# Patient Record
Sex: Male | Born: 1955 | Race: White | Hispanic: No | Marital: Married | Smoking: Never smoker
Health system: Southern US, Community
[De-identification: ages and names within clinical notes are randomized; demographics above are authoritative.]

## PROBLEM LIST (undated history)

## (undated) DIAGNOSIS — E7800 Pure hypercholesterolemia, unspecified: Secondary | ICD-10-CM

## (undated) DIAGNOSIS — N19 Unspecified kidney failure: Secondary | ICD-10-CM

## (undated) DIAGNOSIS — E785 Hyperlipidemia, unspecified: Secondary | ICD-10-CM

## (undated) DIAGNOSIS — E119 Type 2 diabetes mellitus without complications: Secondary | ICD-10-CM

## (undated) DIAGNOSIS — I10 Essential (primary) hypertension: Secondary | ICD-10-CM

## (undated) DIAGNOSIS — K219 Gastro-esophageal reflux disease without esophagitis: Secondary | ICD-10-CM

## (undated) HISTORY — DX: Type 2 diabetes mellitus without complications: E11.9

## (undated) HISTORY — DX: Gastro-esophageal reflux disease without esophagitis: K21.9

## (undated) HISTORY — DX: Pure hypercholesterolemia, unspecified: E78.00

## (undated) HISTORY — DX: Essential (primary) hypertension: I10

## (undated) HISTORY — DX: Unspecified kidney failure: N19

## (undated) HISTORY — DX: Hyperlipidemia, unspecified: E78.5

## (undated) HISTORY — PX: HERNIA REPAIR: SHX51

---

## 1997-12-19 ENCOUNTER — Ambulatory Visit: Admission: RE | Admit: 1997-12-19 | Discharge: 1997-12-19 | Payer: Self-pay | Admitting: Otolaryngology

## 1999-01-23 ENCOUNTER — Emergency Department (HOSPITAL_COMMUNITY): Admission: EM | Admit: 1999-01-23 | Discharge: 1999-01-23 | Payer: Self-pay | Admitting: *Deleted

## 2004-03-30 ENCOUNTER — Inpatient Hospital Stay (HOSPITAL_COMMUNITY): Admission: AC | Admit: 2004-03-30 | Discharge: 2004-04-15 | Payer: Self-pay

## 2004-04-14 ENCOUNTER — Ambulatory Visit: Payer: Self-pay | Admitting: Physical Medicine & Rehabilitation

## 2004-04-15 ENCOUNTER — Inpatient Hospital Stay (HOSPITAL_COMMUNITY)
Admission: RE | Admit: 2004-04-15 | Discharge: 2004-04-26 | Payer: Self-pay | Admitting: Physical Medicine & Rehabilitation

## 2004-04-29 ENCOUNTER — Encounter (HOSPITAL_COMMUNITY)
Admission: RE | Admit: 2004-04-29 | Discharge: 2004-05-29 | Payer: Self-pay | Admitting: Physical Medicine & Rehabilitation

## 2004-05-28 ENCOUNTER — Encounter
Admission: RE | Admit: 2004-05-28 | Discharge: 2004-08-26 | Payer: Self-pay | Admitting: Physical Medicine & Rehabilitation

## 2004-06-02 ENCOUNTER — Ambulatory Visit: Payer: Self-pay | Admitting: Physical Medicine & Rehabilitation

## 2004-06-03 ENCOUNTER — Encounter (HOSPITAL_COMMUNITY)
Admission: RE | Admit: 2004-06-03 | Discharge: 2004-07-03 | Payer: Self-pay | Admitting: Physical Medicine & Rehabilitation

## 2004-07-05 ENCOUNTER — Encounter (HOSPITAL_COMMUNITY)
Admission: RE | Admit: 2004-07-05 | Discharge: 2004-08-04 | Payer: Self-pay | Admitting: Physical Medicine & Rehabilitation

## 2004-07-16 ENCOUNTER — Encounter
Admission: RE | Admit: 2004-07-16 | Discharge: 2004-10-14 | Payer: Self-pay | Admitting: Physical Medicine & Rehabilitation

## 2004-07-16 ENCOUNTER — Ambulatory Visit: Payer: Self-pay | Admitting: Psychology

## 2004-08-09 ENCOUNTER — Ambulatory Visit: Payer: Self-pay | Admitting: Physical Medicine & Rehabilitation

## 2004-09-03 ENCOUNTER — Encounter
Admission: RE | Admit: 2004-09-03 | Discharge: 2004-12-02 | Payer: Self-pay | Admitting: Physical Medicine & Rehabilitation

## 2004-11-24 ENCOUNTER — Ambulatory Visit: Payer: Self-pay | Admitting: Physical Medicine & Rehabilitation
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
## 2005-06-01 ENCOUNTER — Ambulatory Visit (HOSPITAL_BASED_OUTPATIENT_CLINIC_OR_DEPARTMENT_OTHER): Admission: RE | Admit: 2005-06-01 | Discharge: 2005-06-01 | Payer: Self-pay | Admitting: Ophthalmology

## 2010-06-20 ENCOUNTER — Encounter: Payer: Self-pay | Admitting: Neurosurgery

## 2011-09-08 DIAGNOSIS — E7800 Pure hypercholesterolemia, unspecified: Secondary | ICD-10-CM | POA: Diagnosis not present

## 2011-09-12 DIAGNOSIS — I10 Essential (primary) hypertension: Secondary | ICD-10-CM | POA: Diagnosis not present

## 2011-09-12 DIAGNOSIS — IMO0001 Reserved for inherently not codable concepts without codable children: Secondary | ICD-10-CM | POA: Diagnosis not present

## 2011-09-12 DIAGNOSIS — M109 Gout, unspecified: Secondary | ICD-10-CM | POA: Diagnosis not present

## 2011-09-12 DIAGNOSIS — E781 Pure hyperglyceridemia: Secondary | ICD-10-CM | POA: Diagnosis not present

## 2011-09-12 DIAGNOSIS — E7800 Pure hypercholesterolemia, unspecified: Secondary | ICD-10-CM | POA: Diagnosis not present

## 2012-01-23 DIAGNOSIS — E781 Pure hyperglyceridemia: Secondary | ICD-10-CM | POA: Diagnosis not present

## 2012-01-23 DIAGNOSIS — IMO0001 Reserved for inherently not codable concepts without codable children: Secondary | ICD-10-CM | POA: Diagnosis not present

## 2012-01-23 DIAGNOSIS — I10 Essential (primary) hypertension: Secondary | ICD-10-CM | POA: Diagnosis not present

## 2012-01-28 DIAGNOSIS — L500 Allergic urticaria: Secondary | ICD-10-CM | POA: Diagnosis not present

## 2012-01-28 DIAGNOSIS — R55 Syncope and collapse: Secondary | ICD-10-CM | POA: Diagnosis not present

## 2012-01-28 DIAGNOSIS — I959 Hypotension, unspecified: Secondary | ICD-10-CM | POA: Diagnosis not present

## 2012-01-28 DIAGNOSIS — T7840XA Allergy, unspecified, initial encounter: Secondary | ICD-10-CM | POA: Diagnosis not present

## 2012-01-28 DIAGNOSIS — M79609 Pain in unspecified limb: Secondary | ICD-10-CM | POA: Diagnosis not present

## 2012-01-28 DIAGNOSIS — Z79899 Other long term (current) drug therapy: Secondary | ICD-10-CM | POA: Diagnosis not present

## 2012-02-22 DIAGNOSIS — I10 Essential (primary) hypertension: Secondary | ICD-10-CM | POA: Diagnosis not present

## 2012-02-22 DIAGNOSIS — E119 Type 2 diabetes mellitus without complications: Secondary | ICD-10-CM | POA: Diagnosis not present

## 2012-02-22 DIAGNOSIS — E781 Pure hyperglyceridemia: Secondary | ICD-10-CM | POA: Diagnosis not present

## 2012-02-22 DIAGNOSIS — E7800 Pure hypercholesterolemia, unspecified: Secondary | ICD-10-CM | POA: Diagnosis not present

## 2012-02-22 DIAGNOSIS — Z23 Encounter for immunization: Secondary | ICD-10-CM | POA: Diagnosis not present

## 2012-06-12 DIAGNOSIS — E7800 Pure hypercholesterolemia, unspecified: Secondary | ICD-10-CM | POA: Diagnosis not present

## 2012-06-12 DIAGNOSIS — N508 Other specified disorders of male genital organs: Secondary | ICD-10-CM | POA: Diagnosis not present

## 2012-06-12 DIAGNOSIS — E291 Testicular hypofunction: Secondary | ICD-10-CM | POA: Diagnosis not present

## 2012-06-12 DIAGNOSIS — Z79899 Other long term (current) drug therapy: Secondary | ICD-10-CM | POA: Diagnosis not present

## 2012-06-12 DIAGNOSIS — J309 Allergic rhinitis, unspecified: Secondary | ICD-10-CM | POA: Diagnosis not present

## 2012-06-12 DIAGNOSIS — N400 Enlarged prostate without lower urinary tract symptoms: Secondary | ICD-10-CM | POA: Diagnosis not present

## 2012-06-12 DIAGNOSIS — N529 Male erectile dysfunction, unspecified: Secondary | ICD-10-CM | POA: Diagnosis not present

## 2012-06-15 DIAGNOSIS — N509 Disorder of male genital organs, unspecified: Secondary | ICD-10-CM | POA: Diagnosis not present

## 2012-06-15 DIAGNOSIS — N508 Other specified disorders of male genital organs: Secondary | ICD-10-CM | POA: Diagnosis not present

## 2012-06-15 DIAGNOSIS — N433 Hydrocele, unspecified: Secondary | ICD-10-CM | POA: Diagnosis not present

## 2012-06-20 DIAGNOSIS — E7800 Pure hypercholesterolemia, unspecified: Secondary | ICD-10-CM | POA: Diagnosis not present

## 2012-06-20 DIAGNOSIS — E349 Endocrine disorder, unspecified: Secondary | ICD-10-CM | POA: Diagnosis not present

## 2012-06-20 DIAGNOSIS — N529 Male erectile dysfunction, unspecified: Secondary | ICD-10-CM | POA: Diagnosis not present

## 2012-06-20 DIAGNOSIS — E291 Testicular hypofunction: Secondary | ICD-10-CM | POA: Diagnosis not present

## 2012-06-20 DIAGNOSIS — E119 Type 2 diabetes mellitus without complications: Secondary | ICD-10-CM | POA: Diagnosis not present

## 2012-06-20 DIAGNOSIS — I10 Essential (primary) hypertension: Secondary | ICD-10-CM | POA: Diagnosis not present

## 2012-06-25 ENCOUNTER — Ambulatory Visit: Payer: Self-pay | Admitting: Internal Medicine

## 2012-06-26 ENCOUNTER — Ambulatory Visit: Payer: Self-pay | Admitting: Internal Medicine

## 2012-06-29 ENCOUNTER — Ambulatory Visit: Payer: Self-pay | Admitting: Internal Medicine

## 2012-07-02 ENCOUNTER — Ambulatory Visit (INDEPENDENT_AMBULATORY_CARE_PROVIDER_SITE_OTHER): Payer: Medicare Other | Admitting: Internal Medicine

## 2012-07-02 ENCOUNTER — Encounter: Payer: Self-pay | Admitting: Internal Medicine

## 2012-07-02 VITALS — BP 110/82 | HR 97 | Temp 98.1°F | Resp 16 | Ht 70.0 in | Wt 225.0 lb

## 2012-07-02 DIAGNOSIS — M109 Gout, unspecified: Secondary | ICD-10-CM | POA: Insufficient documentation

## 2012-07-02 DIAGNOSIS — E785 Hyperlipidemia, unspecified: Secondary | ICD-10-CM | POA: Insufficient documentation

## 2012-07-02 DIAGNOSIS — N433 Hydrocele, unspecified: Secondary | ICD-10-CM | POA: Insufficient documentation

## 2012-07-02 DIAGNOSIS — R7989 Other specified abnormal findings of blood chemistry: Secondary | ICD-10-CM | POA: Insufficient documentation

## 2012-07-02 DIAGNOSIS — E291 Testicular hypofunction: Secondary | ICD-10-CM

## 2012-07-02 DIAGNOSIS — I10 Essential (primary) hypertension: Secondary | ICD-10-CM | POA: Insufficient documentation

## 2012-07-02 DIAGNOSIS — E119 Type 2 diabetes mellitus without complications: Secondary | ICD-10-CM | POA: Insufficient documentation

## 2012-07-02 DIAGNOSIS — N529 Male erectile dysfunction, unspecified: Secondary | ICD-10-CM

## 2012-07-02 MED ORDER — TADALAFIL 10 MG PO TABS: 10.0000 mg | ORAL_TABLET | Freq: Every day | ORAL | Status: AC | PRN

## 2012-07-02 NOTE — Progress Notes (Signed)
Subjective:     Patient ID: Steven Dominguez, male   DOB: April 29, 1956, 57 y.o.   MRN: [id number]  HPI Steven Dominguez is a 57 year old man, referred by his PCP, Dr. Neita Carp, for evaluation and treatment for low testosterone.  Steven Dominguez has a history of type 2 diabetes, controlled, with a last hemoglobin A1c of 6.9% on 06/12/2012. He also started to have erectile dysfunction 3-4 years ago (cannot maintain an erection). He has tried Viagra and this is not helping at all. He did not try Cialis. He has a pump at home. He had a testosterone level checked 2 weeks ago, and a total testosterone (collected at 9:45 AM) returned low, at 253 ng/dL ([phone]). Also, a Prolactin level was mildly high, at 18 (4-15.2). Patient was referred to endocrinology. He has missed the previous 2 appointments due to inclement weather - comes from [place].   I reviewed his records obtained from Dr. Neita Carp. Other labs obtained 2 weeks ago: LH 5.2 (0.7-8.6), TSH 2.74 (0.45-4.5), lipid panel: 137/265/28/56. CMP normal except glucose 182, total protein 5.9 (6-8.5). The BUN/creatinine ratio was 19/1.17. The PSA was 0.6. He had a testicular ultrasound on 06/15/2012 which showed bilateral hydroceles, right more than left.  No decreased libido, no decrease general drive - he was exercising 3-4 x a week, 30 min, no increased   fatigability; in the last 3-4 weeks, only exercising 1-2 x a week Has problems with erections - cannot maintain one No trauma to testes, testicular irradiation. Had left inguinal hernia surgery "long time ago". No h/o of mumps orchitis/h/o autoimmune ds. No h/o cryptorchidism. He grew and went through puberty like his peers.  No incomplete/delayed sexual development. No breast discomfort/gynecomastia/breast discharge.  No loss of body hair (axillary/pubic)/decreased need for shaving.  No very small testes (<5 ml)/but noticed shrinking of testes.  No height loss.  Normal smell. No hot flushes.  No FH of  hypogonadism/infertility - had 2 children. No vision problems.  Has head trauma 2005. No worst HA of his life, had milder HAs.  No FH of hemochromatosis or pituitary tumors. No excessive weight gain or loss.  No chronic pain. Not on opiates, does not take steroids other than Flonase 1-2x/week. No more than 2 drinks a day of alcohol at a time, and this is rarely.  No anabolic steroids use.  No Herbal medicines.  Not on antidepressants.  He is on disability b/c gout - last April >> could not walk. He had OSA dx 15 years ago and was on a CPAP (or BiPAP) machine, not in last 10 years.   Review of Systems Constitutional: no weight gain/loss, no fatigue, no subjective hyperthermia/hypothermia; poor sleep Eyes: no blurry vision, no xerophthalmia ENT: no sore throat, no nodules palpated in throat, no dysphagia/odynophagia, no hoarseness Cardiovascular: no CP/SOB/palpitations/leg swelling Respiratory: no cough/SOB Gastrointestinal: no N/V/+ acid reflux/both D and C, D<C Musculoskeletal: no muscle/joint aches Skin: no rashes Neurological: no tremors/numbness/tingling/dizziness/ has tinnitus Psychiatric: no depression/anxiety  Patient Active Problem List  Diagnosis  . Low testosterone  . Bilateral hydrocele  . Hyperlipidemia  . Hypertension  . Diabetes type 2, controlled  . Erectile dysfunction  . Gout   Past Surgical History  Procedure Date  . Hernia repair    History   Social History  . Marital Status: Married    Spouse Name: N/A    Number of Children: 2: 35, 33  . Years of Education: N/A   Occupational History  . Disabled, sings in choir  Social History Main Topics  . Smoking status: Never Smoker   . Smokeless tobacco: No  . Alcohol Use: No  . Drug Use: No  . Sexually Active: Yes    Birth Control/ Protection: Other-see comments     Comment: REASON FOR VISIT.   Other Topics Concern  . 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 Diabetes Mother   . Hyperlipidemia Mother   . Cancer Father     LUNG  . Diabetes Brother    Objective:   Physical Exam BP 110/82  Pulse 97  Temp 98.1 F (36.7 C) (Oral)  Resp 16  Ht 5\' 10"  (1.778 m)  Wt 225 lb (102.059 kg)  BMI 32.28 kg/m2  SpO2 95% Constitutional: overweight, in NAD Eyes: PERRLA, EOMI, no exophthalmos ENT: moist mucous membranes, no thyromegaly, no cervical lymphadenopathy Cardiovascular: RRR, No MRG, periankle edema, nonpitting, bilat. Respiratory: CTA B Gastrointestinal: abdomen soft, NT, ND, BS+ Musculoskeletal: no deformities, strength intact in all 4 Skin: moist, warm, no rashes Neurological: no tremor with outstretched hands, DTR normal in all 4 Genital exam: normal male escutcheon, no inguinal LAD, normal phallus, testes ~25 mL right, 20 mL left, no testicular masses, no penile discharge  Assessment:     1. Low testosterone - slightly high prolactin - low LH - normal TSH  2. Hyperprolactinemia  3. ED - Viagra not helping - has penile pump - did not use it yet    Plan:     1.  Low testosterone We discussed about possible causes for patient's low testosterone - both primary and central hypogonadism - I explained that, based on the labs obtained by Dr. 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 If  hypogonadism without other abnormality, we can likely start him on testosterone. - I will call him about the tests - RTC in 6 mo  2. Hyperprolactinemia - We discussed about the fact that a slightly elevated prolactin might not be indicative of pathology, it may be a spurious lab value, it can indicate the presence of macroprolactin, it can be elevated by certain drugs, and also by lack of sleep, stress, chest wall trauma or lesions, etc. Pituitary causes of high prolactin are prolactinoma or tumors compressing the pituitary stalk.  - I would like to first repeat the test, and if prolactin still high, we will probably need to get a pituitary MRI.   2. ED - He tried Viagra without results, but I recommended that he tries Cialis, since pts that do not respond to one PDE5 inhibitor can respond to another one in the class - Rx given - if this not working, I recommended that he starts using the penile pump

## 2012-07-02 NOTE — Patient Instructions (Addendum)
Please return in 6 months.  I will call you with the results of your labs.

## 2012-09-24 DIAGNOSIS — E7800 Pure hypercholesterolemia, unspecified: Secondary | ICD-10-CM | POA: Diagnosis not present

## 2012-09-24 DIAGNOSIS — E349 Endocrine disorder, unspecified: Secondary | ICD-10-CM | POA: Diagnosis not present

## 2012-09-24 DIAGNOSIS — I10 Essential (primary) hypertension: Secondary | ICD-10-CM | POA: Diagnosis not present

## 2012-09-24 DIAGNOSIS — E119 Type 2 diabetes mellitus without complications: Secondary | ICD-10-CM | POA: Diagnosis not present

## 2012-09-24 DIAGNOSIS — N529 Male erectile dysfunction, unspecified: Secondary | ICD-10-CM | POA: Diagnosis not present

## 2012-10-01 DIAGNOSIS — E291 Testicular hypofunction: Secondary | ICD-10-CM | POA: Diagnosis not present

## 2012-10-01 DIAGNOSIS — E7800 Pure hypercholesterolemia, unspecified: Secondary | ICD-10-CM | POA: Diagnosis not present

## 2012-10-01 DIAGNOSIS — N529 Male erectile dysfunction, unspecified: Secondary | ICD-10-CM | POA: Diagnosis not present

## 2012-10-01 DIAGNOSIS — E119 Type 2 diabetes mellitus without complications: Secondary | ICD-10-CM | POA: Diagnosis not present

## 2012-10-01 DIAGNOSIS — I10 Essential (primary) hypertension: Secondary | ICD-10-CM | POA: Diagnosis not present

## 2012-10-01 DIAGNOSIS — E349 Endocrine disorder, unspecified: Secondary | ICD-10-CM | POA: Diagnosis not present

## 2012-10-18 ENCOUNTER — Ambulatory Visit: Payer: Medicare Other | Admitting: Internal Medicine

## 2012-10-19 ENCOUNTER — Ambulatory Visit: Payer: Medicare Other | Admitting: Internal Medicine

## 2012-11-01 ENCOUNTER — Ambulatory Visit: Payer: Medicare Other | Admitting: Internal Medicine

## 2012-11-29 ENCOUNTER — Ambulatory Visit: Payer: Medicare Other | Admitting: Internal Medicine

## 2012-12-10 ENCOUNTER — Ambulatory Visit: Payer: Medicare Other | Admitting: Internal Medicine

## 2012-12-31 ENCOUNTER — Encounter: Payer: Self-pay | Admitting: Internal Medicine

## 2012-12-31 ENCOUNTER — Ambulatory Visit (INDEPENDENT_AMBULATORY_CARE_PROVIDER_SITE_OTHER): Payer: Medicare Other | Admitting: Internal Medicine

## 2012-12-31 VITALS — BP 118/64 | HR 94 | Temp 97.6°F | Resp 12 | Wt 206.0 lb

## 2012-12-31 DIAGNOSIS — E291 Testicular hypofunction: Secondary | ICD-10-CM | POA: Diagnosis not present

## 2012-12-31 NOTE — Progress Notes (Addendum)
Subjective:     Patient ID: Steven Dominguez, male   DOB: 06/10/1955, 57 y.o.   MRN: [id number]  HPI Steven Dominguez is a 57 year old man, returning for f/u for low testosterone. Last visit was 5 mo ago. He did not return to have labs checked after our last visit.  Reviewed hx: Steven Dominguez has a history of type 2 diabetes, controlled, with a last hemoglobin A1c of 6.9% on 06/12/2012. He also started to have erectile dysfunction 4 years ago (can get but cannot maintain an erection). He has tried Viagra and this was not helping. He did not try Cialis. He has a pump at home, which he has been using.   He had a low testosterone 6 mo ago: total testosterone (collected at 9:45 AM) 253 ng/dL ([phone]). Also, a Prolactin level was mildly high, at 18 (4-15.2). Patient was referred to endocrinology.   No decreased libido, no decrease general drive - he was exercising 3-4 x a week, 30 min, no increased fatigability; in the last 3-4 weeks, only exercising 1-2 x a week. Has problems with erections - cannot maintain one.  At last visit, I ordered an extensive set of labs to confirm and further investigate his low testosterone and higher prolactin level, however, patient did not return to have labs checked. Also, at last visit, I suggested that he tried Cialis instead of Viagra and see if this works better, since there is no class effect for these medicines. He did not try this yet.   He is on disability b/c gout - last April >> could not walk. He had OSA dx 15 years ago and was on a CPAP (or BiPAP) machine, not in last 10 years.   I reviewed pt's medications, allergies, PMH, social hx, family hx and no changes required, except as mentioned above.  Review of Systems Constitutional: no weight gain/loss, no fatigue, no subjective hyperthermia/hypothermia; poor sleep Eyes: no blurry vision, no xerophthalmia ENT: no sore throat, no nodules palpated in throat, no dysphagia/odynophagia, no hoarseness Cardiovascular: no  CP/SOB/palpitations/leg swelling Respiratory: no cough/SOB Gastrointestinal: no N/V/+ acid reflux/both D and C, D<C Musculoskeletal: no muscle/joint aches Skin: no rashes Neurological: no tremors/numbness/tingling/dizziness/ has tinnitus  Objective:   Physical Exam BP 118/64  Pulse 94  Temp(Src) 97.6 F (36.4 C) (Oral)  Resp 12  Wt 206 lb (93.441 kg)  BMI 29.56 kg/m2  SpO2 95% Wt Readings from Last 3 Encounters:  12/31/12 206 lb (93.441 kg)  07/02/12 225 lb (102.059 kg)  He lost 19 pounds since last visit!  Constitutional: overweight, in NAD Eyes: PERRLA, EOMI, no exophthalmos ENT: moist mucous membranes, no thyromegaly, no cervical lymphadenopathy Cardiovascular: RRR, No MRG, periankle edema, nonpitting, bilat. Respiratory: CTA B Gastrointestinal: abdomen soft, NT, ND, BS+ Musculoskeletal: no deformities, strength intact in all 4 Skin: moist, warm, no rashes Neurological: no tremor with outstretched hands, DTR normal in all 4  Assessment:     1. Low testosterone Per records obtained from Dr. Neita Carp - labs from 06/12/2012:  - total testosterone (9:45 AM) 253 ng/dL ([phone]) - slightly high prolactin at 18 (4-15.2) - LH 5.2 (0.7-8.6) - TSH 2.74 (0.45-4.5) - lipid panel: 137/265/28/56 - CMP normal except glucose 182, total protein 5.9 (6-8.5). The BUN/creatinine ratio was 19/1.17.  - PSA was 0.6 - HbA1C 6.9% - testicular ultrasound on 06/15/2012 which showed bilateral hydroceles, right more than left.  2. Hyperprolactinemia - slightly high prolactin at 18 (4-15.2)  3. ED - Viagra not helping - has penile pump -  did not try Cialis    Plan:     1.  Low testosterone At last visit, I ordered an extensive set of labs to elucidate whether the patient has primary or central hypogonadism - based on the labs obtained by Dr. 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 Since he the have a low testosterone, I ordered a  repeat test at 8 in the morning and will include a free testosterone, an LH, FSH, PRL, hemochromatosis tests (ferritin, TIBC, iron), acromegaly test (IGF-I), Cushing tests (ACTH, cortisol), thyroid tests (TSH, free T4, free T3), and an estradiol level. If hypogonadism without other abnormality, we can likely start him on testosterone. - I will call him about the test results - RTC in 6 mo  2. Hyperprolactinemia - at last visit, we discussed about the fact that a slightly elevated prolactin might not be indicative of pathology, it may be a spurious lab value, it can indicate the presence of macroprolactin, it can be elevated by certain drugs, and also by lack of sleep, stress, chest wall trauma or lesions, etc. Pituitary causes of high prolactin are prolactinoma or tumors compressing the pituitary stalk.  - I would like to first repeat the test, and if prolactin still high, we will probably need to get a pituitary MRI.   2. ED - He tried Viagra without results, but I recommended that he tries Cialis, since pts that do not respond to one PDE5 inhibitor can respond to another one in the class - Rx given at last visit, but we did not try to yet - he started using the penile pump  We'll addend the lab results as they become available.  03/04/2013:  March 04, 2013   Steven Dominguez [address] [place] [address]  Dear Steven Dominguez, Below are the results from your recent visit: LUTEINIZING HORMONE      Result Value Range   LH 3.78  1.50 - 9.30 mIU/mL  FOLLICLE STIMULATING HORMONE      Result Value Range   FSH 6.8  1.4 - 18.1 mIU/ML  CORTISOL      Result Value Range   Cortisol, Plasma 8.7    T4, FREE      Result Value Range   Free T4 0.68  0.60 - 1.60 ng/dL  T3, FREE      Result Value Range   T3, Free 2.9  2.3 - 4.2 pg/mL  FERRITIN      Result Value Range   Ferritin 26.5  22.0 - 322.0 ng/mL  IBC PANEL      Result Value Range   Iron 87  42 - 165 ug/dL   Transferrin 782.9  562.1  - 360.0 mg/dL   Saturation Ratios 30.8  20.0 - 50.0 %  TESTOSTERONE, FREE, TOTAL      Result Value Range   Testosterone 258 (*) 300 - 890 ng/dL   Sex Hormone Binding 25  13 - 71 nmol/L   Testosterone, Free 58.3  47.0 - 244.0 pg/mL   Testosterone-% Freee. 2.3  1.6 - 2.9 %   Narrative:    Performed at:  [facility]                [address], [address]                [place], [place] [address]  PROLACTIN      Result Value Range   Prolactin 5.9  2.1 - 17.1 ng/mL   Narrative:    Performed at:  [facility]                [address], [address]                [place], [place] [address]  ACTH      Result Value Range   C206 ACTH 20  10 - 46 pg/mL   Narrative:    Performed at:  [facility]                [address]                [place], [place] [address]  INSULIN-LIKE GROWTH FACTOR      Result Value Range   Somatomedin (IGF-I) 200 (*) 35 - 196 ng/mL   Narrative:    Performed at:  [facility]                [address]                [place], [place] [address]  ESTRADIOL, FREE      Result Value Range   Estradiol, Free 0.47 (*)    Estradiol 20     Results Received 02/12/13     Narrative:    Performed at:  [facility]                [address]                [place], [place] [address]  Your total testosterone his low, however, the active testosterone fraction (free testosterone) is normal. Also, you do not appear to have other pituitary problem. Your thyroid tests are also normal. You do not appear to have hemochromatosis, which is a condition that can cause low testosterone. Your IGF1 is slightly high (this is related to growth hormone) and I would like to repeat an IGF1 level. Please try to return to the clinic for another blood sample, on an empty stomach, and I'll let you know the results as soon as they are available.  If you have any questions or concerns, please don't hesitate to  call. Sincerely, Carlus Pavlov, MD

## 2012-12-31 NOTE — Patient Instructions (Signed)
Please return at 8 am, fasting since 12 am the night before.

## 2013-01-07 ENCOUNTER — Other Ambulatory Visit: Payer: Medicare Other

## 2013-01-14 ENCOUNTER — Other Ambulatory Visit: Payer: Medicare Other

## 2013-01-29 ENCOUNTER — Other Ambulatory Visit: Payer: Medicare Other

## 2013-01-31 ENCOUNTER — Other Ambulatory Visit: Payer: Medicare Other

## 2013-02-01 ENCOUNTER — Other Ambulatory Visit (INDEPENDENT_AMBULATORY_CARE_PROVIDER_SITE_OTHER): Payer: Medicare Other

## 2013-02-01 ENCOUNTER — Other Ambulatory Visit: Payer: Self-pay | Admitting: Internal Medicine

## 2013-02-01 DIAGNOSIS — R7989 Other specified abnormal findings of blood chemistry: Secondary | ICD-10-CM

## 2013-02-01 DIAGNOSIS — E291 Testicular hypofunction: Secondary | ICD-10-CM

## 2013-02-01 LAB — T3, FREE: T3, Free: 2.9 pg/mL (ref 2.3–4.2)

## 2013-02-01 LAB — LUTEINIZING HORMONE: LH: 3.78 m[IU]/mL (ref 1.50–9.30)

## 2013-02-01 LAB — T4, FREE: Free T4: 0.68 ng/dL (ref 0.60–1.60)

## 2013-02-01 LAB — IBC PANEL
Iron: 87 ug/dL (ref 42–165)
Saturation Ratios: 20.5 % (ref 20.0–50.0)
Transferrin: 302.4 mg/dL (ref 212.0–360.0)

## 2013-02-01 LAB — CORTISOL: Cortisol, Plasma: 8.7 ug/dL

## 2013-02-01 LAB — FERRITIN: Ferritin: 26.5 ng/mL (ref 22.0–322.0)

## 2013-02-01 LAB — FOLLICLE STIMULATING HORMONE: FSH: 6.8 m[IU]/mL (ref 1.4–18.1)

## 2013-02-02 LAB — PROLACTIN: Prolactin: 5.9 ng/mL (ref 2.1–17.1)

## 2013-02-04 LAB — TESTOSTERONE, FREE, TOTAL, SHBG
Sex Hormone Binding: 25 nmol/L (ref 13–71)
Testosterone, Free: 58.3 pg/mL (ref 47.0–244.0)
Testosterone-% Free: 2.3 % (ref 1.6–2.9)
Testosterone: 258 ng/dL — ABNORMAL LOW (ref 300–890)

## 2013-02-04 LAB — ACTH: C206 ACTH: 20 pg/mL (ref 10–46)

## 2013-02-04 LAB — INSULIN-LIKE GROWTH FACTOR: Somatomedin (IGF-I): 200 ng/mL — ABNORMAL HIGH (ref 35–196)

## 2013-02-12 LAB — ESTRADIOL, FREE
Estradiol, Free: 0.47 pg/mL — ABNORMAL HIGH
Estradiol: 20 pg/mL

## 2013-02-14 DIAGNOSIS — I10 Essential (primary) hypertension: Secondary | ICD-10-CM | POA: Diagnosis not present

## 2013-02-14 DIAGNOSIS — E119 Type 2 diabetes mellitus without complications: Secondary | ICD-10-CM | POA: Diagnosis not present

## 2013-02-14 DIAGNOSIS — E349 Endocrine disorder, unspecified: Secondary | ICD-10-CM | POA: Diagnosis not present

## 2013-02-14 DIAGNOSIS — R7309 Other abnormal glucose: Secondary | ICD-10-CM | POA: Diagnosis not present

## 2013-02-14 DIAGNOSIS — E7800 Pure hypercholesterolemia, unspecified: Secondary | ICD-10-CM | POA: Diagnosis not present

## 2013-02-15 DIAGNOSIS — E119 Type 2 diabetes mellitus without complications: Secondary | ICD-10-CM | POA: Diagnosis not present

## 2013-02-21 DIAGNOSIS — E119 Type 2 diabetes mellitus without complications: Secondary | ICD-10-CM | POA: Diagnosis not present

## 2013-02-21 DIAGNOSIS — N529 Male erectile dysfunction, unspecified: Secondary | ICD-10-CM | POA: Diagnosis not present

## 2013-02-21 DIAGNOSIS — Z23 Encounter for immunization: Secondary | ICD-10-CM | POA: Diagnosis not present

## 2013-02-21 DIAGNOSIS — E291 Testicular hypofunction: Secondary | ICD-10-CM | POA: Diagnosis not present

## 2013-02-21 DIAGNOSIS — E7800 Pure hypercholesterolemia, unspecified: Secondary | ICD-10-CM | POA: Diagnosis not present

## 2013-02-21 DIAGNOSIS — E349 Endocrine disorder, unspecified: Secondary | ICD-10-CM | POA: Diagnosis not present

## 2013-02-21 DIAGNOSIS — I10 Essential (primary) hypertension: Secondary | ICD-10-CM | POA: Diagnosis not present

## 2013-03-04 ENCOUNTER — Encounter: Payer: Self-pay | Admitting: Internal Medicine

## 2013-03-04 NOTE — Addendum Note (Signed)
Addended by: Carlus Pavlov on: 03/04/2013 08:31 AM   Modules accepted: Orders

## 2013-06-26 DIAGNOSIS — E119 Type 2 diabetes mellitus without complications: Secondary | ICD-10-CM | POA: Diagnosis not present

## 2013-06-26 DIAGNOSIS — I10 Essential (primary) hypertension: Secondary | ICD-10-CM | POA: Diagnosis not present

## 2013-06-26 DIAGNOSIS — E7800 Pure hypercholesterolemia, unspecified: Secondary | ICD-10-CM | POA: Diagnosis not present

## 2013-07-01 ENCOUNTER — Ambulatory Visit (INDEPENDENT_AMBULATORY_CARE_PROVIDER_SITE_OTHER): Payer: Medicare Other | Admitting: Internal Medicine

## 2013-07-01 ENCOUNTER — Encounter: Payer: Self-pay | Admitting: Internal Medicine

## 2013-07-01 VITALS — BP 124/68 | HR 100 | Temp 97.5°F | Resp 12 | Wt 222.8 lb

## 2013-07-01 DIAGNOSIS — E349 Endocrine disorder, unspecified: Secondary | ICD-10-CM | POA: Diagnosis not present

## 2013-07-01 DIAGNOSIS — N529 Male erectile dysfunction, unspecified: Secondary | ICD-10-CM | POA: Diagnosis not present

## 2013-07-01 DIAGNOSIS — E119 Type 2 diabetes mellitus without complications: Secondary | ICD-10-CM

## 2013-07-01 DIAGNOSIS — E291 Testicular hypofunction: Secondary | ICD-10-CM

## 2013-07-01 DIAGNOSIS — Z23 Encounter for immunization: Secondary | ICD-10-CM | POA: Diagnosis not present

## 2013-07-01 DIAGNOSIS — E7800 Pure hypercholesterolemia, unspecified: Secondary | ICD-10-CM | POA: Diagnosis not present

## 2013-07-01 DIAGNOSIS — I10 Essential (primary) hypertension: Secondary | ICD-10-CM | POA: Diagnosis not present

## 2013-07-01 DIAGNOSIS — R7989 Other specified abnormal findings of blood chemistry: Secondary | ICD-10-CM

## 2013-07-01 DIAGNOSIS — M109 Gout, unspecified: Secondary | ICD-10-CM | POA: Diagnosis not present

## 2013-07-01 NOTE — Progress Notes (Signed)
Subjective:     Patient ID: Steven Dominguez, male   DOB: January 28, 1956, 58 y.o.   MRN: [id number]  HPI Steven Dominguez is a 58 y.o. man, returning for f/u for low testosterone and a high IGF1. Last visit was 5 mo ago.   Reviewed hx: Steven Dominguez has a history of type 2 diabetes, controlled. He also started to have erectile dysfunction 4 years ago (can get but cannot maintain an erection).   He had a low testosterone 1 year ago: total testosterone (collected at 9:45 AM) 253 ng/dL ([phone]). Also, a Prolactin level was mildly high, at 18 (4-15.2). Patient was referred to endocrinology.   We repeated labs in 02/2013, when free Testosterone was normal: Component     Latest Ref Rng 02/01/2013  Testosterone     300 - 890 ng/dL 258 (L)  Sex Hormone Binding     13 - 71 nmol/L 25  Testosterone Free     47.0 - 244.0 pg/mL 58.3  Testosterone-% Free     1.6 - 2.9 % 2.3  Estradiol, Free      0.47 (H)  Estradiol      20  Results received      02/12/13  Prolactin     2.1 - 17.1 ng/mL 5.9  LH     1.50 - 9.30 mIU/mL 3.78  FSH     1.4 - 18.1 mIU/ML 6.8   Because of the higher Estradiol >> we checked a testicular ultrasound on 06/15/2012 which showed bilateral hydroceles, right more than left, but no testicular masses.  The IBC panel and the rest of the pituitary hormones were normal except a slightly higher IGF1:  Component     Latest Ref Rng 02/01/2013  Iron     42 - 165 ug/dL 87  Transferrin     212.0 - 360.0 mg/dL 302.4  Saturation Ratios     20.0 - 50.0 % 20.5  Somatomedin (IGF-I)     35 - 196 ng/mL 200 (H)  Cortisol, Plasma      8.7  C206 ACTH     10 - 46 pg/mL 20  Free T4     0.60 - 1.60 ng/dL 0.68  T3, Free     2.3 - 4.2 pg/mL 2.9  Ferritin     22.0 - 322.0 ng/mL 26.5   He denies increase in ring size, increased arthralgias or increased sweating.  No decreased libido, no decrease general drive, but has problems with erections - cannot maintain one.  He is on disability b/c  gout. He had OSA dx 15 years ago and was on a CPAP (or BiPAP) machine, not in last 10 years.   I reviewed pt's medications, allergies, PMH, social hx, family hx and no changes required, except as mentioned above.  Review of Systems - see HPI + Constitutional: no weight gain/loss, no fatigue, no subjective hyperthermia/hypothermia; + poor sleep, + nocturia Eyes: no blurry vision, no xerophthalmia ENT: no sore throat, no nodules palpated in throat, no dysphagia/odynophagia, no hoarseness, + decreased hearing Cardiovascular: no CP/SOB/palpitations/leg swelling Respiratory: no cough/SOB Gastrointestinal: no N/V//D/C Musculoskeletal: no muscle/joint aches Skin: no rashes Neurological: no tremors/numbness/tingling/dizziness/+ tinnitus  Objective:   Physical Exam BP 124/68  Pulse 100  Temp(Src) 97.5 F (36.4 C) (Oral)  Resp 12  Wt 222 lb 12.8 oz (101.061 kg)  SpO2 96% Wt Readings from Last 3 Encounters:  07/01/13 222 lb 12.8 oz (101.061 kg)  12/31/12 206 lb (93.441 kg)  07/02/12 225 lb (102.059 kg)  He lost 19 pounds since last visit!  Constitutional: overweight, in NAD Eyes: PERRLA, EOMI, no exophthalmos ENT: moist mucous membranes, no thyromegaly, no cervical lymphadenopathy Cardiovascular: RRR, No MRG, periankle edema, nonpitting, bilat. Respiratory: CTA B Gastrointestinal: abdomen soft, NT, ND, BS+ Musculoskeletal: no deformities, strength intact in all 4 Skin: moist, warm, no rashes Neurological: no tremor with outstretched hands, DTR normal in all 4  Assessment:     1. Low testosterone See labs in HPI + Per records obtained from Dr. Quintin Alto - labs from 06/12/2012:  - total testosterone (9:45 AM) 253 ng/dL ([phone]) - slightly high prolactin at 18 (4-15.2) - LH 5.2 (0.7-8.6) - TSH 2.74 (0.45-4.5) - lipid panel: 137/265/28/56 - CMP normal except glucose 182, total protein 5.9 (6-8.5). The BUN/creatinine ratio was 19/1.17.  - PSA was 0.6 - HbA1C 6.9% - testicular  ultrasound on 06/15/2012 which showed bilateral hydroceles, right more than left.  2. Hyperprolactinemia - slightly high prolactin at 18 (4-15.2) - resolved  3. ED  Plan:     1.  Low testosterone At last visit, I ordered an extensive set of labs to elucidate whether the patient has primary or central hypogonadism >> he had normal free T, however a higher IGF1 - we will repeat a testosterone level and an IGF1 at 8 am, fasting - if all normal, he can continue to follow with PCP and return to see me on an as needed basis - if he develops low free Testosterone  2. Hyperprolactinemia - resolved per last check  3. ED - will recheck the testosterone. If normal >> can continue to try PDE5 inh.  Orders Placed This Encounter  Procedures  . Testosterone, free, total  . Insulin-like growth factor   I will addend the results when they become available.

## 2013-07-01 NOTE — Patient Instructions (Signed)
Please come to the lab at 8 am on an empty stomach. We will schedule a new appointment if labs are abnormal.

## 2013-10-22 DIAGNOSIS — R7309 Other abnormal glucose: Secondary | ICD-10-CM | POA: Diagnosis not present

## 2013-10-22 DIAGNOSIS — I10 Essential (primary) hypertension: Secondary | ICD-10-CM | POA: Diagnosis not present

## 2013-10-22 DIAGNOSIS — E7800 Pure hypercholesterolemia, unspecified: Secondary | ICD-10-CM | POA: Diagnosis not present

## 2013-10-22 DIAGNOSIS — IMO0001 Reserved for inherently not codable concepts without codable children: Secondary | ICD-10-CM | POA: Diagnosis not present

## 2013-11-19 DIAGNOSIS — E349 Endocrine disorder, unspecified: Secondary | ICD-10-CM | POA: Diagnosis not present

## 2013-11-19 DIAGNOSIS — N529 Male erectile dysfunction, unspecified: Secondary | ICD-10-CM | POA: Diagnosis not present

## 2013-11-19 DIAGNOSIS — E291 Testicular hypofunction: Secondary | ICD-10-CM | POA: Diagnosis not present

## 2013-11-19 DIAGNOSIS — IMO0001 Reserved for inherently not codable concepts without codable children: Secondary | ICD-10-CM | POA: Diagnosis not present

## 2013-11-19 DIAGNOSIS — I10 Essential (primary) hypertension: Secondary | ICD-10-CM | POA: Diagnosis not present

## 2013-11-19 DIAGNOSIS — M109 Gout, unspecified: Secondary | ICD-10-CM | POA: Diagnosis not present

## 2013-11-19 DIAGNOSIS — R490 Dysphonia: Secondary | ICD-10-CM | POA: Diagnosis not present

## 2013-11-19 DIAGNOSIS — E7800 Pure hypercholesterolemia, unspecified: Secondary | ICD-10-CM | POA: Diagnosis not present

## 2013-12-09 DIAGNOSIS — J339 Nasal polyp, unspecified: Secondary | ICD-10-CM | POA: Diagnosis not present

## 2013-12-20 DIAGNOSIS — J339 Nasal polyp, unspecified: Secondary | ICD-10-CM | POA: Diagnosis not present

## 2013-12-20 DIAGNOSIS — J3489 Other specified disorders of nose and nasal sinuses: Secondary | ICD-10-CM | POA: Diagnosis not present

## 2013-12-26 ENCOUNTER — Telehealth: Payer: Self-pay

## 2013-12-26 DIAGNOSIS — J310 Chronic rhinitis: Secondary | ICD-10-CM | POA: Diagnosis not present

## 2013-12-26 NOTE — Telephone Encounter (Signed)
Diabetic Bundle.Lvom for pt to call back and set appointment up with Dr. Cruzita Lederer.

## 2014-03-11 DIAGNOSIS — I10 Essential (primary) hypertension: Secondary | ICD-10-CM | POA: Diagnosis not present

## 2014-03-11 DIAGNOSIS — E119 Type 2 diabetes mellitus without complications: Secondary | ICD-10-CM | POA: Diagnosis not present

## 2014-03-11 DIAGNOSIS — E780 Pure hypercholesterolemia: Secondary | ICD-10-CM | POA: Diagnosis not present

## 2014-03-12 DIAGNOSIS — E119 Type 2 diabetes mellitus without complications: Secondary | ICD-10-CM | POA: Diagnosis not present

## 2014-03-21 DIAGNOSIS — E782 Mixed hyperlipidemia: Secondary | ICD-10-CM | POA: Diagnosis not present

## 2014-03-21 DIAGNOSIS — I10 Essential (primary) hypertension: Secondary | ICD-10-CM | POA: Diagnosis not present

## 2014-03-21 DIAGNOSIS — E1165 Type 2 diabetes mellitus with hyperglycemia: Secondary | ICD-10-CM | POA: Diagnosis not present

## 2014-03-21 DIAGNOSIS — M1000 Idiopathic gout, unspecified site: Secondary | ICD-10-CM | POA: Diagnosis not present

## 2014-03-21 DIAGNOSIS — B351 Tinea unguium: Secondary | ICD-10-CM | POA: Diagnosis not present

## 2014-07-15 DIAGNOSIS — E782 Mixed hyperlipidemia: Secondary | ICD-10-CM | POA: Diagnosis not present

## 2014-07-15 DIAGNOSIS — E1165 Type 2 diabetes mellitus with hyperglycemia: Secondary | ICD-10-CM | POA: Diagnosis not present

## 2014-07-15 DIAGNOSIS — I10 Essential (primary) hypertension: Secondary | ICD-10-CM | POA: Diagnosis not present

## 2014-07-16 DIAGNOSIS — E782 Mixed hyperlipidemia: Secondary | ICD-10-CM | POA: Diagnosis not present

## 2014-07-23 DIAGNOSIS — E782 Mixed hyperlipidemia: Secondary | ICD-10-CM | POA: Diagnosis not present

## 2014-07-23 DIAGNOSIS — I10 Essential (primary) hypertension: Secondary | ICD-10-CM | POA: Diagnosis not present

## 2014-07-23 DIAGNOSIS — Z1389 Encounter for screening for other disorder: Secondary | ICD-10-CM | POA: Diagnosis not present

## 2014-07-23 DIAGNOSIS — M1000 Idiopathic gout, unspecified site: Secondary | ICD-10-CM | POA: Diagnosis not present

## 2014-07-23 DIAGNOSIS — E1165 Type 2 diabetes mellitus with hyperglycemia: Secondary | ICD-10-CM | POA: Diagnosis not present

## 2014-11-12 DIAGNOSIS — E780 Pure hypercholesterolemia: Secondary | ICD-10-CM | POA: Diagnosis not present

## 2014-11-12 DIAGNOSIS — E782 Mixed hyperlipidemia: Secondary | ICD-10-CM | POA: Diagnosis not present

## 2014-11-12 DIAGNOSIS — E1165 Type 2 diabetes mellitus with hyperglycemia: Secondary | ICD-10-CM | POA: Diagnosis not present

## 2014-11-19 DIAGNOSIS — E1165 Type 2 diabetes mellitus with hyperglycemia: Secondary | ICD-10-CM | POA: Diagnosis not present

## 2014-11-19 DIAGNOSIS — E782 Mixed hyperlipidemia: Secondary | ICD-10-CM | POA: Diagnosis not present

## 2014-11-19 DIAGNOSIS — M1000 Idiopathic gout, unspecified site: Secondary | ICD-10-CM | POA: Diagnosis not present

## 2014-11-19 DIAGNOSIS — I10 Essential (primary) hypertension: Secondary | ICD-10-CM | POA: Diagnosis not present

## 2014-11-20 DIAGNOSIS — I10 Essential (primary) hypertension: Secondary | ICD-10-CM | POA: Diagnosis not present

## 2014-12-20 DIAGNOSIS — I10 Essential (primary) hypertension: Secondary | ICD-10-CM | POA: Diagnosis not present

## 2015-01-20 DIAGNOSIS — I10 Essential (primary) hypertension: Secondary | ICD-10-CM | POA: Diagnosis not present

## 2015-02-20 DIAGNOSIS — I10 Essential (primary) hypertension: Secondary | ICD-10-CM | POA: Diagnosis not present

## 2015-03-22 DIAGNOSIS — I10 Essential (primary) hypertension: Secondary | ICD-10-CM | POA: Diagnosis not present

## 2015-03-25 DIAGNOSIS — E1165 Type 2 diabetes mellitus with hyperglycemia: Secondary | ICD-10-CM | POA: Diagnosis not present

## 2015-03-25 DIAGNOSIS — E7800 Pure hypercholesterolemia, unspecified: Secondary | ICD-10-CM | POA: Diagnosis not present

## 2015-03-25 DIAGNOSIS — I10 Essential (primary) hypertension: Secondary | ICD-10-CM | POA: Diagnosis not present

## 2015-04-01 DIAGNOSIS — E1165 Type 2 diabetes mellitus with hyperglycemia: Secondary | ICD-10-CM | POA: Diagnosis not present

## 2015-04-01 DIAGNOSIS — I10 Essential (primary) hypertension: Secondary | ICD-10-CM | POA: Diagnosis not present

## 2015-04-01 DIAGNOSIS — M1000 Idiopathic gout, unspecified site: Secondary | ICD-10-CM | POA: Diagnosis not present

## 2015-04-01 DIAGNOSIS — E782 Mixed hyperlipidemia: Secondary | ICD-10-CM | POA: Diagnosis not present

## 2015-04-01 DIAGNOSIS — Z23 Encounter for immunization: Secondary | ICD-10-CM | POA: Diagnosis not present

## 2015-04-22 DIAGNOSIS — E119 Type 2 diabetes mellitus without complications: Secondary | ICD-10-CM | POA: Diagnosis not present

## 2015-04-22 DIAGNOSIS — I10 Essential (primary) hypertension: Secondary | ICD-10-CM | POA: Diagnosis not present

## 2015-05-22 DIAGNOSIS — I10 Essential (primary) hypertension: Secondary | ICD-10-CM | POA: Diagnosis not present

## 2015-05-22 DIAGNOSIS — E119 Type 2 diabetes mellitus without complications: Secondary | ICD-10-CM | POA: Diagnosis not present

## 2015-07-27 DIAGNOSIS — E782 Mixed hyperlipidemia: Secondary | ICD-10-CM | POA: Diagnosis not present

## 2015-07-27 DIAGNOSIS — I10 Essential (primary) hypertension: Secondary | ICD-10-CM | POA: Diagnosis not present

## 2015-07-27 DIAGNOSIS — E7800 Pure hypercholesterolemia, unspecified: Secondary | ICD-10-CM | POA: Diagnosis not present

## 2015-07-27 DIAGNOSIS — E1165 Type 2 diabetes mellitus with hyperglycemia: Secondary | ICD-10-CM | POA: Diagnosis not present

## 2015-07-31 DIAGNOSIS — E782 Mixed hyperlipidemia: Secondary | ICD-10-CM | POA: Diagnosis not present

## 2015-07-31 DIAGNOSIS — M1000 Idiopathic gout, unspecified site: Secondary | ICD-10-CM | POA: Diagnosis not present

## 2015-07-31 DIAGNOSIS — I10 Essential (primary) hypertension: Secondary | ICD-10-CM | POA: Diagnosis not present

## 2015-07-31 DIAGNOSIS — E1165 Type 2 diabetes mellitus with hyperglycemia: Secondary | ICD-10-CM | POA: Diagnosis not present

## 2015-07-31 DIAGNOSIS — Z1389 Encounter for screening for other disorder: Secondary | ICD-10-CM | POA: Diagnosis not present

## 2015-08-05 DIAGNOSIS — H5210 Myopia, unspecified eye: Secondary | ICD-10-CM | POA: Diagnosis not present

## 2015-08-05 DIAGNOSIS — H5213 Myopia, bilateral: Secondary | ICD-10-CM | POA: Diagnosis not present

## 2015-08-06 DIAGNOSIS — H524 Presbyopia: Secondary | ICD-10-CM | POA: Diagnosis not present

## 2015-08-06 DIAGNOSIS — H35033 Hypertensive retinopathy, bilateral: Secondary | ICD-10-CM | POA: Diagnosis not present

## 2015-08-20 DIAGNOSIS — Z0100 Encounter for examination of eyes and vision without abnormal findings: Secondary | ICD-10-CM | POA: Diagnosis not present

## 2015-10-09 LAB — BASIC METABOLIC PANEL WITH GFR: Glucose: 251 mg/dL

## 2015-11-04 DIAGNOSIS — I10 Essential (primary) hypertension: Secondary | ICD-10-CM | POA: Diagnosis not present

## 2015-11-04 DIAGNOSIS — E7800 Pure hypercholesterolemia, unspecified: Secondary | ICD-10-CM | POA: Diagnosis not present

## 2015-11-04 DIAGNOSIS — E782 Mixed hyperlipidemia: Secondary | ICD-10-CM | POA: Diagnosis not present

## 2015-11-04 DIAGNOSIS — E1165 Type 2 diabetes mellitus with hyperglycemia: Secondary | ICD-10-CM | POA: Diagnosis not present

## 2015-11-06 DIAGNOSIS — M1000 Idiopathic gout, unspecified site: Secondary | ICD-10-CM | POA: Diagnosis not present

## 2015-11-06 DIAGNOSIS — I10 Essential (primary) hypertension: Secondary | ICD-10-CM | POA: Diagnosis not present

## 2015-11-06 DIAGNOSIS — E1165 Type 2 diabetes mellitus with hyperglycemia: Secondary | ICD-10-CM | POA: Diagnosis not present

## 2015-11-06 DIAGNOSIS — E782 Mixed hyperlipidemia: Secondary | ICD-10-CM | POA: Diagnosis not present

## 2015-12-11 DIAGNOSIS — S60561A Insect bite (nonvenomous) of right hand, initial encounter: Secondary | ICD-10-CM | POA: Diagnosis not present

## 2015-12-11 DIAGNOSIS — Z6831 Body mass index (BMI) 31.0-31.9, adult: Secondary | ICD-10-CM | POA: Diagnosis not present

## 2015-12-11 DIAGNOSIS — L0390 Cellulitis, unspecified: Secondary | ICD-10-CM | POA: Diagnosis not present

## 2016-02-12 LAB — GLUCOSE, POCT (MANUAL RESULT ENTRY): POC Glucose: 199 mg/dl — AB (ref 70–99)

## 2016-03-08 DIAGNOSIS — E782 Mixed hyperlipidemia: Secondary | ICD-10-CM | POA: Diagnosis not present

## 2016-03-08 DIAGNOSIS — I10 Essential (primary) hypertension: Secondary | ICD-10-CM | POA: Diagnosis not present

## 2016-03-08 DIAGNOSIS — E1165 Type 2 diabetes mellitus with hyperglycemia: Secondary | ICD-10-CM | POA: Diagnosis not present

## 2016-03-11 DIAGNOSIS — E782 Mixed hyperlipidemia: Secondary | ICD-10-CM | POA: Diagnosis not present

## 2016-03-11 DIAGNOSIS — Z6832 Body mass index (BMI) 32.0-32.9, adult: Secondary | ICD-10-CM | POA: Diagnosis not present

## 2016-03-11 DIAGNOSIS — E1142 Type 2 diabetes mellitus with diabetic polyneuropathy: Secondary | ICD-10-CM | POA: Diagnosis not present

## 2016-03-11 DIAGNOSIS — Z23 Encounter for immunization: Secondary | ICD-10-CM | POA: Diagnosis not present

## 2016-03-11 DIAGNOSIS — E1165 Type 2 diabetes mellitus with hyperglycemia: Secondary | ICD-10-CM | POA: Diagnosis not present

## 2016-03-11 DIAGNOSIS — I10 Essential (primary) hypertension: Secondary | ICD-10-CM | POA: Diagnosis not present

## 2016-03-11 DIAGNOSIS — M1000 Idiopathic gout, unspecified site: Secondary | ICD-10-CM | POA: Diagnosis not present

## 2016-10-26 DIAGNOSIS — R600 Localized edema: Secondary | ICD-10-CM | POA: Diagnosis not present

## 2016-10-26 DIAGNOSIS — Z6832 Body mass index (BMI) 32.0-32.9, adult: Secondary | ICD-10-CM | POA: Diagnosis not present

## 2017-03-22 DIAGNOSIS — I10 Essential (primary) hypertension: Secondary | ICD-10-CM | POA: Diagnosis not present

## 2017-03-22 DIAGNOSIS — E7800 Pure hypercholesterolemia, unspecified: Secondary | ICD-10-CM | POA: Diagnosis not present

## 2017-03-22 DIAGNOSIS — E782 Mixed hyperlipidemia: Secondary | ICD-10-CM | POA: Diagnosis not present

## 2017-03-22 DIAGNOSIS — E1165 Type 2 diabetes mellitus with hyperglycemia: Secondary | ICD-10-CM | POA: Diagnosis not present

## 2017-03-22 DIAGNOSIS — E1142 Type 2 diabetes mellitus with diabetic polyneuropathy: Secondary | ICD-10-CM | POA: Diagnosis not present

## 2017-03-29 DIAGNOSIS — E1142 Type 2 diabetes mellitus with diabetic polyneuropathy: Secondary | ICD-10-CM | POA: Diagnosis not present

## 2017-03-29 DIAGNOSIS — E1165 Type 2 diabetes mellitus with hyperglycemia: Secondary | ICD-10-CM | POA: Diagnosis not present

## 2017-03-29 DIAGNOSIS — M1000 Idiopathic gout, unspecified site: Secondary | ICD-10-CM | POA: Diagnosis not present

## 2017-03-29 DIAGNOSIS — Z6832 Body mass index (BMI) 32.0-32.9, adult: Secondary | ICD-10-CM | POA: Diagnosis not present

## 2017-03-29 DIAGNOSIS — E782 Mixed hyperlipidemia: Secondary | ICD-10-CM | POA: Diagnosis not present

## 2017-03-29 DIAGNOSIS — Z23 Encounter for immunization: Secondary | ICD-10-CM | POA: Diagnosis not present

## 2017-03-29 DIAGNOSIS — Z0001 Encounter for general adult medical examination with abnormal findings: Secondary | ICD-10-CM | POA: Diagnosis not present

## 2017-03-29 DIAGNOSIS — I10 Essential (primary) hypertension: Secondary | ICD-10-CM | POA: Diagnosis not present

## 2017-03-29 DIAGNOSIS — Z1389 Encounter for screening for other disorder: Secondary | ICD-10-CM | POA: Diagnosis not present

## 2017-06-29 DIAGNOSIS — E782 Mixed hyperlipidemia: Secondary | ICD-10-CM | POA: Diagnosis not present

## 2017-06-29 DIAGNOSIS — E1142 Type 2 diabetes mellitus with diabetic polyneuropathy: Secondary | ICD-10-CM | POA: Diagnosis not present

## 2017-06-29 DIAGNOSIS — I10 Essential (primary) hypertension: Secondary | ICD-10-CM | POA: Diagnosis not present

## 2017-06-29 DIAGNOSIS — M1000 Idiopathic gout, unspecified site: Secondary | ICD-10-CM | POA: Diagnosis not present

## 2017-06-29 DIAGNOSIS — Z6832 Body mass index (BMI) 32.0-32.9, adult: Secondary | ICD-10-CM | POA: Diagnosis not present

## 2017-06-29 DIAGNOSIS — E1165 Type 2 diabetes mellitus with hyperglycemia: Secondary | ICD-10-CM | POA: Diagnosis not present

## 2017-08-31 DIAGNOSIS — Z0100 Encounter for examination of eyes and vision without abnormal findings: Secondary | ICD-10-CM | POA: Diagnosis not present

## 2017-08-31 DIAGNOSIS — H524 Presbyopia: Secondary | ICD-10-CM | POA: Diagnosis not present

## 2017-10-25 DIAGNOSIS — Z6830 Body mass index (BMI) 30.0-30.9, adult: Secondary | ICD-10-CM | POA: Diagnosis not present

## 2017-10-25 DIAGNOSIS — E1142 Type 2 diabetes mellitus with diabetic polyneuropathy: Secondary | ICD-10-CM | POA: Diagnosis not present

## 2017-10-25 DIAGNOSIS — F5221 Male erectile disorder: Secondary | ICD-10-CM | POA: Diagnosis not present

## 2017-10-25 DIAGNOSIS — I10 Essential (primary) hypertension: Secondary | ICD-10-CM | POA: Diagnosis not present

## 2017-10-25 DIAGNOSIS — E1165 Type 2 diabetes mellitus with hyperglycemia: Secondary | ICD-10-CM | POA: Diagnosis not present

## 2017-10-25 DIAGNOSIS — E782 Mixed hyperlipidemia: Secondary | ICD-10-CM | POA: Diagnosis not present

## 2017-10-25 DIAGNOSIS — J301 Allergic rhinitis due to pollen: Secondary | ICD-10-CM | POA: Diagnosis not present

## 2017-10-25 DIAGNOSIS — M1000 Idiopathic gout, unspecified site: Secondary | ICD-10-CM | POA: Diagnosis not present

## 2018-02-20 DIAGNOSIS — E7800 Pure hypercholesterolemia, unspecified: Secondary | ICD-10-CM | POA: Diagnosis not present

## 2018-02-20 DIAGNOSIS — E1165 Type 2 diabetes mellitus with hyperglycemia: Secondary | ICD-10-CM | POA: Diagnosis not present

## 2018-02-20 DIAGNOSIS — I10 Essential (primary) hypertension: Secondary | ICD-10-CM | POA: Diagnosis not present

## 2018-02-20 DIAGNOSIS — E782 Mixed hyperlipidemia: Secondary | ICD-10-CM | POA: Diagnosis not present

## 2018-02-20 DIAGNOSIS — E1142 Type 2 diabetes mellitus with diabetic polyneuropathy: Secondary | ICD-10-CM | POA: Diagnosis not present

## 2018-02-22 DIAGNOSIS — E1165 Type 2 diabetes mellitus with hyperglycemia: Secondary | ICD-10-CM | POA: Diagnosis not present

## 2018-02-22 DIAGNOSIS — E782 Mixed hyperlipidemia: Secondary | ICD-10-CM | POA: Diagnosis not present

## 2018-02-22 DIAGNOSIS — I10 Essential (primary) hypertension: Secondary | ICD-10-CM | POA: Diagnosis not present

## 2018-02-22 DIAGNOSIS — Z23 Encounter for immunization: Secondary | ICD-10-CM | POA: Diagnosis not present

## 2018-02-22 DIAGNOSIS — E1159 Type 2 diabetes mellitus with other circulatory complications: Secondary | ICD-10-CM | POA: Diagnosis not present

## 2018-02-22 DIAGNOSIS — M1000 Idiopathic gout, unspecified site: Secondary | ICD-10-CM | POA: Diagnosis not present

## 2018-02-22 DIAGNOSIS — Z6831 Body mass index (BMI) 31.0-31.9, adult: Secondary | ICD-10-CM | POA: Diagnosis not present

## 2018-02-22 DIAGNOSIS — N521 Erectile dysfunction due to diseases classified elsewhere: Secondary | ICD-10-CM | POA: Diagnosis not present

## 2018-04-12 DIAGNOSIS — B3742 Candidal balanitis: Secondary | ICD-10-CM | POA: Diagnosis not present

## 2018-04-12 DIAGNOSIS — Z6831 Body mass index (BMI) 31.0-31.9, adult: Secondary | ICD-10-CM | POA: Diagnosis not present

## 2018-06-20 DIAGNOSIS — M1000 Idiopathic gout, unspecified site: Secondary | ICD-10-CM | POA: Diagnosis not present

## 2018-06-20 DIAGNOSIS — Z23 Encounter for immunization: Secondary | ICD-10-CM | POA: Diagnosis not present

## 2018-06-20 DIAGNOSIS — E782 Mixed hyperlipidemia: Secondary | ICD-10-CM | POA: Diagnosis not present

## 2018-06-20 DIAGNOSIS — I10 Essential (primary) hypertension: Secondary | ICD-10-CM | POA: Diagnosis not present

## 2018-06-20 DIAGNOSIS — E1159 Type 2 diabetes mellitus with other circulatory complications: Secondary | ICD-10-CM | POA: Diagnosis not present

## 2018-06-20 DIAGNOSIS — F5221 Male erectile disorder: Secondary | ICD-10-CM | POA: Diagnosis not present

## 2018-06-20 DIAGNOSIS — Z6831 Body mass index (BMI) 31.0-31.9, adult: Secondary | ICD-10-CM | POA: Diagnosis not present

## 2018-06-20 DIAGNOSIS — E1165 Type 2 diabetes mellitus with hyperglycemia: Secondary | ICD-10-CM | POA: Diagnosis not present

## 2018-06-20 DIAGNOSIS — N521 Erectile dysfunction due to diseases classified elsewhere: Secondary | ICD-10-CM | POA: Diagnosis not present

## 2018-06-20 DIAGNOSIS — Z0001 Encounter for general adult medical examination with abnormal findings: Secondary | ICD-10-CM | POA: Diagnosis not present

## 2018-06-20 DIAGNOSIS — Z6830 Body mass index (BMI) 30.0-30.9, adult: Secondary | ICD-10-CM | POA: Diagnosis not present

## 2018-09-27 DIAGNOSIS — E1165 Type 2 diabetes mellitus with hyperglycemia: Secondary | ICD-10-CM | POA: Diagnosis not present

## 2018-09-27 DIAGNOSIS — I10 Essential (primary) hypertension: Secondary | ICD-10-CM | POA: Diagnosis not present

## 2018-10-15 DIAGNOSIS — E1140 Type 2 diabetes mellitus with diabetic neuropathy, unspecified: Secondary | ICD-10-CM | POA: Diagnosis not present

## 2018-10-15 DIAGNOSIS — I10 Essential (primary) hypertension: Secondary | ICD-10-CM | POA: Diagnosis not present

## 2018-10-15 DIAGNOSIS — E781 Pure hyperglyceridemia: Secondary | ICD-10-CM | POA: Diagnosis not present

## 2018-10-18 DIAGNOSIS — M1000 Idiopathic gout, unspecified site: Secondary | ICD-10-CM | POA: Diagnosis not present

## 2018-10-18 DIAGNOSIS — Z6831 Body mass index (BMI) 31.0-31.9, adult: Secondary | ICD-10-CM | POA: Diagnosis not present

## 2018-10-18 DIAGNOSIS — I10 Essential (primary) hypertension: Secondary | ICD-10-CM | POA: Diagnosis not present

## 2018-10-18 DIAGNOSIS — E782 Mixed hyperlipidemia: Secondary | ICD-10-CM | POA: Diagnosis not present

## 2018-10-18 DIAGNOSIS — N521 Erectile dysfunction due to diseases classified elsewhere: Secondary | ICD-10-CM | POA: Diagnosis not present

## 2018-10-18 DIAGNOSIS — E1159 Type 2 diabetes mellitus with other circulatory complications: Secondary | ICD-10-CM | POA: Diagnosis not present

## 2018-10-18 DIAGNOSIS — E1165 Type 2 diabetes mellitus with hyperglycemia: Secondary | ICD-10-CM | POA: Diagnosis not present

## 2018-10-18 DIAGNOSIS — E1122 Type 2 diabetes mellitus with diabetic chronic kidney disease: Secondary | ICD-10-CM | POA: Diagnosis not present

## 2018-10-27 DIAGNOSIS — E782 Mixed hyperlipidemia: Secondary | ICD-10-CM | POA: Diagnosis not present

## 2018-10-27 DIAGNOSIS — I10 Essential (primary) hypertension: Secondary | ICD-10-CM | POA: Diagnosis not present

## 2018-11-27 DIAGNOSIS — E1140 Type 2 diabetes mellitus with diabetic neuropathy, unspecified: Secondary | ICD-10-CM | POA: Diagnosis not present

## 2018-11-27 DIAGNOSIS — E782 Mixed hyperlipidemia: Secondary | ICD-10-CM | POA: Diagnosis not present

## 2018-11-27 DIAGNOSIS — I10 Essential (primary) hypertension: Secondary | ICD-10-CM | POA: Diagnosis not present

## 2019-07-26 DIAGNOSIS — I10 Essential (primary) hypertension: Secondary | ICD-10-CM | POA: Diagnosis not present

## 2019-07-26 DIAGNOSIS — E7849 Other hyperlipidemia: Secondary | ICD-10-CM | POA: Diagnosis not present

## 2019-08-28 DIAGNOSIS — E1165 Type 2 diabetes mellitus with hyperglycemia: Secondary | ICD-10-CM | POA: Diagnosis not present

## 2019-08-28 DIAGNOSIS — I10 Essential (primary) hypertension: Secondary | ICD-10-CM | POA: Diagnosis not present

## 2019-09-26 DIAGNOSIS — I10 Essential (primary) hypertension: Secondary | ICD-10-CM | POA: Diagnosis not present

## 2019-09-26 DIAGNOSIS — E7849 Other hyperlipidemia: Secondary | ICD-10-CM | POA: Diagnosis not present

## 2019-09-27 DIAGNOSIS — I10 Essential (primary) hypertension: Secondary | ICD-10-CM | POA: Diagnosis not present

## 2019-09-27 DIAGNOSIS — E7849 Other hyperlipidemia: Secondary | ICD-10-CM | POA: Diagnosis not present

## 2019-10-16 DIAGNOSIS — E7800 Pure hypercholesterolemia, unspecified: Secondary | ICD-10-CM | POA: Diagnosis not present

## 2019-10-16 DIAGNOSIS — E1142 Type 2 diabetes mellitus with diabetic polyneuropathy: Secondary | ICD-10-CM | POA: Diagnosis not present

## 2019-10-16 DIAGNOSIS — E1122 Type 2 diabetes mellitus with diabetic chronic kidney disease: Secondary | ICD-10-CM | POA: Diagnosis not present

## 2019-10-16 DIAGNOSIS — E781 Pure hyperglyceridemia: Secondary | ICD-10-CM | POA: Diagnosis not present

## 2019-10-16 DIAGNOSIS — E1165 Type 2 diabetes mellitus with hyperglycemia: Secondary | ICD-10-CM | POA: Diagnosis not present

## 2019-10-16 DIAGNOSIS — E782 Mixed hyperlipidemia: Secondary | ICD-10-CM | POA: Diagnosis not present

## 2019-10-22 DIAGNOSIS — M1000 Idiopathic gout, unspecified site: Secondary | ICD-10-CM | POA: Diagnosis not present

## 2019-10-22 DIAGNOSIS — E1165 Type 2 diabetes mellitus with hyperglycemia: Secondary | ICD-10-CM | POA: Diagnosis not present

## 2019-10-22 DIAGNOSIS — E1122 Type 2 diabetes mellitus with diabetic chronic kidney disease: Secondary | ICD-10-CM | POA: Diagnosis not present

## 2019-10-22 DIAGNOSIS — E1140 Type 2 diabetes mellitus with diabetic neuropathy, unspecified: Secondary | ICD-10-CM | POA: Diagnosis not present

## 2019-10-22 DIAGNOSIS — I10 Essential (primary) hypertension: Secondary | ICD-10-CM | POA: Diagnosis not present

## 2019-10-28 DIAGNOSIS — I129 Hypertensive chronic kidney disease with stage 1 through stage 4 chronic kidney disease, or unspecified chronic kidney disease: Secondary | ICD-10-CM | POA: Diagnosis not present

## 2019-10-28 DIAGNOSIS — E7849 Other hyperlipidemia: Secondary | ICD-10-CM | POA: Diagnosis not present

## 2019-10-28 DIAGNOSIS — E1122 Type 2 diabetes mellitus with diabetic chronic kidney disease: Secondary | ICD-10-CM | POA: Diagnosis not present

## 2019-10-28 DIAGNOSIS — N183 Chronic kidney disease, stage 3 unspecified: Secondary | ICD-10-CM | POA: Diagnosis not present

## 2019-11-27 DIAGNOSIS — E1122 Type 2 diabetes mellitus with diabetic chronic kidney disease: Secondary | ICD-10-CM | POA: Diagnosis not present

## 2019-11-27 DIAGNOSIS — I129 Hypertensive chronic kidney disease with stage 1 through stage 4 chronic kidney disease, or unspecified chronic kidney disease: Secondary | ICD-10-CM | POA: Diagnosis not present

## 2019-11-27 DIAGNOSIS — E7849 Other hyperlipidemia: Secondary | ICD-10-CM | POA: Diagnosis not present

## 2019-11-27 DIAGNOSIS — N183 Chronic kidney disease, stage 3 unspecified: Secondary | ICD-10-CM | POA: Diagnosis not present

## 2019-12-27 DIAGNOSIS — E1122 Type 2 diabetes mellitus with diabetic chronic kidney disease: Secondary | ICD-10-CM | POA: Diagnosis not present

## 2019-12-27 DIAGNOSIS — N183 Chronic kidney disease, stage 3 unspecified: Secondary | ICD-10-CM | POA: Diagnosis not present

## 2019-12-27 DIAGNOSIS — I129 Hypertensive chronic kidney disease with stage 1 through stage 4 chronic kidney disease, or unspecified chronic kidney disease: Secondary | ICD-10-CM | POA: Diagnosis not present

## 2019-12-27 DIAGNOSIS — E7849 Other hyperlipidemia: Secondary | ICD-10-CM | POA: Diagnosis not present

## 2019-12-31 DIAGNOSIS — Z1211 Encounter for screening for malignant neoplasm of colon: Secondary | ICD-10-CM | POA: Diagnosis not present

## 2019-12-31 DIAGNOSIS — Z1212 Encounter for screening for malignant neoplasm of rectum: Secondary | ICD-10-CM | POA: Diagnosis not present

## 2020-01-20 DIAGNOSIS — E785 Hyperlipidemia, unspecified: Secondary | ICD-10-CM | POA: Diagnosis not present

## 2020-01-20 DIAGNOSIS — K6389 Other specified diseases of intestine: Secondary | ICD-10-CM | POA: Diagnosis not present

## 2020-01-20 DIAGNOSIS — Z1211 Encounter for screening for malignant neoplasm of colon: Secondary | ICD-10-CM | POA: Diagnosis not present

## 2020-01-20 DIAGNOSIS — Z7984 Long term (current) use of oral hypoglycemic drugs: Secondary | ICD-10-CM | POA: Diagnosis not present

## 2020-01-20 DIAGNOSIS — M109 Gout, unspecified: Secondary | ICD-10-CM | POA: Diagnosis not present

## 2020-01-20 DIAGNOSIS — K621 Rectal polyp: Secondary | ICD-10-CM | POA: Diagnosis not present

## 2020-01-20 DIAGNOSIS — K620 Anal polyp: Secondary | ICD-10-CM | POA: Diagnosis not present

## 2020-01-20 DIAGNOSIS — E7800 Pure hypercholesterolemia, unspecified: Secondary | ICD-10-CM | POA: Diagnosis not present

## 2020-01-20 DIAGNOSIS — Z87891 Personal history of nicotine dependence: Secondary | ICD-10-CM | POA: Diagnosis not present

## 2020-01-20 DIAGNOSIS — E119 Type 2 diabetes mellitus without complications: Secondary | ICD-10-CM | POA: Diagnosis not present

## 2020-01-20 DIAGNOSIS — I10 Essential (primary) hypertension: Secondary | ICD-10-CM | POA: Diagnosis not present

## 2020-01-20 DIAGNOSIS — K5730 Diverticulosis of large intestine without perforation or abscess without bleeding: Secondary | ICD-10-CM | POA: Diagnosis not present

## 2020-01-20 DIAGNOSIS — Z79899 Other long term (current) drug therapy: Secondary | ICD-10-CM | POA: Diagnosis not present

## 2020-01-20 DIAGNOSIS — D126 Benign neoplasm of colon, unspecified: Secondary | ICD-10-CM | POA: Diagnosis not present

## 2020-01-22 DIAGNOSIS — M25579 Pain in unspecified ankle and joints of unspecified foot: Secondary | ICD-10-CM | POA: Diagnosis not present

## 2020-01-28 DIAGNOSIS — I129 Hypertensive chronic kidney disease with stage 1 through stage 4 chronic kidney disease, or unspecified chronic kidney disease: Secondary | ICD-10-CM | POA: Diagnosis not present

## 2020-01-28 DIAGNOSIS — E1122 Type 2 diabetes mellitus with diabetic chronic kidney disease: Secondary | ICD-10-CM | POA: Diagnosis not present

## 2020-01-28 DIAGNOSIS — E7849 Other hyperlipidemia: Secondary | ICD-10-CM | POA: Diagnosis not present

## 2020-01-28 DIAGNOSIS — N183 Chronic kidney disease, stage 3 unspecified: Secondary | ICD-10-CM | POA: Diagnosis not present

## 2020-02-04 DIAGNOSIS — D125 Benign neoplasm of sigmoid colon: Secondary | ICD-10-CM | POA: Diagnosis not present

## 2020-02-20 DIAGNOSIS — M109 Gout, unspecified: Secondary | ICD-10-CM | POA: Diagnosis not present

## 2020-02-20 DIAGNOSIS — E1159 Type 2 diabetes mellitus with other circulatory complications: Secondary | ICD-10-CM | POA: Diagnosis not present

## 2020-02-20 DIAGNOSIS — E781 Pure hyperglyceridemia: Secondary | ICD-10-CM | POA: Diagnosis not present

## 2020-02-20 DIAGNOSIS — I10 Essential (primary) hypertension: Secondary | ICD-10-CM | POA: Diagnosis not present

## 2020-02-20 DIAGNOSIS — N183 Chronic kidney disease, stage 3 unspecified: Secondary | ICD-10-CM | POA: Diagnosis not present

## 2020-02-25 DIAGNOSIS — Z23 Encounter for immunization: Secondary | ICD-10-CM | POA: Diagnosis not present

## 2020-02-25 DIAGNOSIS — E782 Mixed hyperlipidemia: Secondary | ICD-10-CM | POA: Diagnosis not present

## 2020-02-25 DIAGNOSIS — I10 Essential (primary) hypertension: Secondary | ICD-10-CM | POA: Diagnosis not present

## 2020-02-25 DIAGNOSIS — E1165 Type 2 diabetes mellitus with hyperglycemia: Secondary | ICD-10-CM | POA: Diagnosis not present

## 2020-02-25 DIAGNOSIS — E1140 Type 2 diabetes mellitus with diabetic neuropathy, unspecified: Secondary | ICD-10-CM | POA: Diagnosis not present

## 2020-02-27 DIAGNOSIS — E1122 Type 2 diabetes mellitus with diabetic chronic kidney disease: Secondary | ICD-10-CM | POA: Diagnosis not present

## 2020-02-27 DIAGNOSIS — I129 Hypertensive chronic kidney disease with stage 1 through stage 4 chronic kidney disease, or unspecified chronic kidney disease: Secondary | ICD-10-CM | POA: Diagnosis not present

## 2020-02-27 DIAGNOSIS — E7849 Other hyperlipidemia: Secondary | ICD-10-CM | POA: Diagnosis not present

## 2020-02-27 DIAGNOSIS — N183 Chronic kidney disease, stage 3 unspecified: Secondary | ICD-10-CM | POA: Diagnosis not present

## 2020-03-28 DIAGNOSIS — E7849 Other hyperlipidemia: Secondary | ICD-10-CM | POA: Diagnosis not present

## 2020-03-28 DIAGNOSIS — E1122 Type 2 diabetes mellitus with diabetic chronic kidney disease: Secondary | ICD-10-CM | POA: Diagnosis not present

## 2020-03-28 DIAGNOSIS — I129 Hypertensive chronic kidney disease with stage 1 through stage 4 chronic kidney disease, or unspecified chronic kidney disease: Secondary | ICD-10-CM | POA: Diagnosis not present

## 2020-03-28 DIAGNOSIS — N183 Chronic kidney disease, stage 3 unspecified: Secondary | ICD-10-CM | POA: Diagnosis not present

## 2020-04-08 ENCOUNTER — Ambulatory Visit (INDEPENDENT_AMBULATORY_CARE_PROVIDER_SITE_OTHER): Payer: Medicare Other | Admitting: Urology

## 2020-04-08 ENCOUNTER — Encounter: Payer: Self-pay | Admitting: Urology

## 2020-04-08 ENCOUNTER — Other Ambulatory Visit: Payer: Self-pay

## 2020-04-08 VITALS — BP 112/70 | HR 94 | Temp 98.5°F | Ht 71.0 in | Wt 206.0 lb

## 2020-04-08 DIAGNOSIS — N5201 Erectile dysfunction due to arterial insufficiency: Secondary | ICD-10-CM | POA: Diagnosis not present

## 2020-04-08 DIAGNOSIS — N402 Nodular prostate without lower urinary tract symptoms: Secondary | ICD-10-CM

## 2020-04-08 DIAGNOSIS — K4091 Unilateral inguinal hernia, without obstruction or gangrene, recurrent: Secondary | ICD-10-CM | POA: Insufficient documentation

## 2020-04-08 LAB — URINALYSIS, ROUTINE W REFLEX MICROSCOPIC
Bilirubin, UA: NEGATIVE
Ketones, UA: NEGATIVE
Leukocytes,UA: NEGATIVE
Nitrite, UA: NEGATIVE
Protein,UA: NEGATIVE
RBC, UA: NEGATIVE
Specific Gravity, UA: 1.02 (ref 1.005–1.030)
Urobilinogen, Ur: 1 mg/dL (ref 0.2–1.0)
pH, UA: 7 (ref 5.0–7.5)

## 2020-04-08 MED ORDER — SILDENAFIL CITRATE 100 MG PO TABS: 100.0000 mg | ORAL_TABLET | Freq: Every day | ORAL | 5 refills | Status: AC | PRN

## 2020-04-08 NOTE — Progress Notes (Signed)
04/08/2020 1:38 PM   Brunilda Payor 27-Aug-1955 [id number]  Referring provider: Manon Hilding, MD [place],  [place] [address]  Left inguinal bulge  HPI: Mr Churchwell is [phone] here for evaluation of a left inguinal bulge. He has noted the bulge for the past 10 years. He notes occasional discomfort related eating and activity. No nausea or vomiting.  He was told he has a prostate nodule.  He has issues getting and maintaining an erection for the past 4 years. He has previously tried sildenafil 20mg  and tadalafil 10mg  without success. He gets a soft erection that is not firm enough for penetration.    PMH: Past Medical History:  Diagnosis Date  . Acid reflux   . Diabetes mellitus without complication ([place])   . High cholesterol   . Hyperlipidemia   . Hypertension   . Kidney failure     Surgical History: Past Surgical History:  Procedure Laterality Date  . HERNIA REPAIR      Home Medications:  Allergies as of 04/08/2020      Reactions   Wasp Venom Protein       Medication List       Accurate as of April 08, 2020  1:38 PM. If you have any questions, ask your nurse or doctor.        Accu-Chek Aviva Plus test strip Generic drug: glucose blood 1 each daily.   Accu-Chek Softclix Lancets lancets SMARTSIG:Topical   allopurinol 300 MG tablet Commonly known as: ZYLOPRIM Take 300 mg by mouth daily.   atorvastatin 40 MG tablet Commonly known as: LIPITOR Take 40 mg by mouth daily.   Benicar 40 MG tablet Generic drug: olmesartan Take 40 mg by mouth daily.   fluticasone 50 MCG/ACT nasal spray Commonly known as: FLONASE Place 1 spray into the nose daily.   metFORMIN 500 MG 24 hr tablet Commonly known as: GLUCOPHAGE-XR Take 500 mg by mouth 2 (two) times daily.   Ozempic (0.25 or 0.5 MG/DOSE) 2 MG/1.5ML Sopn Generic drug: Semaglutide(0.25 or 0.5MG /DOS) Inject into the skin.   sildenafil 20 MG tablet Commonly known as: REVATIO sildenafil (pulmonary  hypertension) 20 mg tablet   tadalafil 10 MG tablet Commonly known as: Cialis Take 1 tablet (10 mg total) by mouth daily as needed for erectile dysfunction.       Allergies:  Allergies  Allergen Reactions  . Wasp Venom Protein     Family History: Family History  Problem Relation Age of Onset  . Diabetes Mother   . Hyperlipidemia Mother   . Cancer Father        LUNG  . Diabetes Brother     Social History:  reports that he has never smoked. He has never used smokeless tobacco. He reports that he does not drink alcohol and does not use drugs.  ROS: All other review of systems were reviewed and are negative except what is noted above in HPI  Physical Exam: BP 112/70   Pulse 94   Temp 98.5 F (36.9 C)   Ht 5\' 11"  (1.803 m)   Wt 206 lb (93.4 kg)   BMI 28.73 kg/m   Constitutional:  Alert and oriented, No acute distress. HEENT: [place] AT, moist mucus membranes.  Trachea midline, no masses. Cardiovascular: No clubbing, cyanosis, or edema. Respiratory: Normal respiratory effort, no increased work of breathing. GI: Abdomen is soft, nontender, nondistended, no abdominal masses. Left inguinal hernia GU: No CVA tenderness. Circumcised phallus. No masses/lesions on penis, testis,  scrotum. Prostate 30g smooth no nodules no induration.  Lymph: No cervical or inguinal lymphadenopathy. Skin: No rashes, bruises or suspicious lesions. Neurologic: Grossly intact, no focal deficits, moving all 4 extremities. Psychiatric: Normal mood and affect.  Laboratory Data: No results found for: WBC, HGB, HCT, MCV, PLT  No results found for: CREATININE  No results found for: PSA  Lab Results  Component Value Date   TESTOSTERONE 258 (L) 02/01/2013    No results found for: HGBA1C  Urinalysis No results found for: COLORURINE, APPEARANCEUR, LABSPEC, PHURINE, GLUCOSEU, HGBUR, BILIRUBINUR, KETONESUR, PROTEINUR, UROBILINOGEN, NITRITE, LEUKOCYTESUR  No results found for: LABMICR, [place], RBCUA,  LABEPIT, MUCUS, BACTERIA  Pertinent Imaging:  No results found for this or any previous visit.  No results found for this or any previous visit.  No results found for this or any previous visit.  No results found for this or any previous visit.  No results found for this or any previous visit.  No results found for this or any previous visit.  No results found for this or any previous visit.  No results found for this or any previous visit.   Assessment & Plan:    1. Prostate nodule -No nodule appreciated no exam today. He will followup in 1 year with [facility] - Urinalysis, Routine w reflex microscopic  2. Erectile dysfunction -sildenafil 100mg   3. Left inguinal hernia -referral to Dr. Arnoldo Morale   No follow-ups on file.  Nicolette Bang, MD  [facility] [place]

## 2020-04-08 NOTE — Progress Notes (Signed)

## 2020-04-28 DIAGNOSIS — I129 Hypertensive chronic kidney disease with stage 1 through stage 4 chronic kidney disease, or unspecified chronic kidney disease: Secondary | ICD-10-CM | POA: Diagnosis not present

## 2020-04-28 DIAGNOSIS — E1122 Type 2 diabetes mellitus with diabetic chronic kidney disease: Secondary | ICD-10-CM | POA: Diagnosis not present

## 2020-04-28 DIAGNOSIS — N183 Chronic kidney disease, stage 3 unspecified: Secondary | ICD-10-CM | POA: Diagnosis not present

## 2020-04-30 ENCOUNTER — Other Ambulatory Visit: Payer: Self-pay

## 2020-04-30 ENCOUNTER — Encounter: Payer: Self-pay | Admitting: General Surgery

## 2020-04-30 ENCOUNTER — Ambulatory Visit (INDEPENDENT_AMBULATORY_CARE_PROVIDER_SITE_OTHER): Payer: Medicare Other | Admitting: General Surgery

## 2020-04-30 VITALS — BP 111/78 | HR 84 | Temp 97.4°F | Resp 16 | Ht 71.0 in | Wt 213.0 lb

## 2020-04-30 DIAGNOSIS — K4090 Unilateral inguinal hernia, without obstruction or gangrene, not specified as recurrent: Secondary | ICD-10-CM | POA: Diagnosis not present

## 2020-04-30 NOTE — Patient Instructions (Signed)

## 2020-04-30 NOTE — Progress Notes (Signed)
Steven Dominguez; [id number]; [birth year]   HPI Is a 64 year old white male who was referred to my care by Dr. Alyson Ingles for evaluation and treatment of a left inguinal hernia.  Patient states he has had a left inguinal hernia for many years.  It only occasionally bothers him when he coughs or strains to move his bowels.  It has started to bother him a little bit more lately, but he denies any nausea or vomiting.  He states he really has not noted a lump in that region until recently.  He remotely had a left inguinal herniorrhaphy many decades ago. Past Medical History:  Diagnosis Date  . Acid reflux   . Diabetes mellitus without complication ([place])   . High cholesterol   . Hyperlipidemia   . Hypertension   . Kidney failure     Past Surgical History:  Procedure Laterality Date  . HERNIA REPAIR      Family History  Problem Relation Age of Onset  . Diabetes Mother   . Hyperlipidemia Mother   . Cancer Father        LUNG  . Diabetes Brother     Current Outpatient Medications on File Prior to Visit  Medication Sig Dispense Refill  . ACCU-CHEK AVIVA PLUS test strip 1 each daily.    . Accu-Chek Softclix Lancets lancets SMARTSIG:Topical    . allopurinol (ZYLOPRIM) 300 MG tablet Take 300 mg by mouth daily.     Marland Kitchen atorvastatin (LIPITOR) 40 MG tablet Take 40 mg by mouth daily.     Marland Kitchen BENICAR 40 MG tablet Take 40 mg by mouth daily.     . metFORMIN (GLUCOPHAGE-XR) 500 MG 24 hr tablet Take 500 mg by mouth 2 (two) times daily.     . Semaglutide,0.25 or 0.5MG /DOS, (OZEMPIC, 0.25 OR 0.5 MG/DOSE,) 2 MG/1.5ML SOPN Inject into the skin.    . sildenafil (REVATIO) 20 MG tablet sildenafil (pulmonary hypertension) 20 mg tablet    . sildenafil (VIAGRA) 100 MG tablet Take 1 tablet (100 mg total) by mouth daily as needed for erectile dysfunction. 10 tablet 5  . tadalafil (CIALIS) 10 MG tablet Take 1 tablet (10 mg total) by mouth daily as needed for erectile dysfunction. 10 tablet 0  . fluticasone (FLONASE) 50  MCG/ACT nasal spray Place 1 spray into the nose daily.  (Patient not taking: Reported on 04/08/2020)     No current facility-administered medications on file prior to visit.    Allergies  Allergen Reactions  . Wasp Venom Protein     Social History   Substance and Sexual Activity  Alcohol Use No    Social History   Tobacco Use  Smoking Status Never Smoker  Smokeless Tobacco Never Used    Review of Systems  Constitutional: Negative.   HENT: Positive for sinus pain.   Eyes: Negative.   Respiratory: Negative.   Cardiovascular: Negative.   Gastrointestinal: Negative.   Genitourinary: Negative.   Musculoskeletal: Negative.   Skin: Negative.   Neurological: Negative.   Endo/Heme/Allergies: Negative.   Psychiatric/Behavioral: Negative.     Objective   Vitals:   04/30/20 1039  BP: 111/78  Pulse: 84  Resp: 16  Temp: (!) 97.4 F (36.3 C)    Physical Exam Vitals reviewed.  Constitutional:      Appearance: Normal appearance. He is not ill-appearing.  HENT:     Head: Normocephalic and atraumatic.  Cardiovascular:     Rate and Rhythm: Normal rate and regular rhythm.     Heart sounds:  Normal heart sounds. No murmur heard.  No friction rub. No gallop.   Pulmonary:     Effort: Pulmonary effort is normal. No respiratory distress.     Breath sounds: Normal breath sounds. No stridor. No wheezing, rhonchi or rales.  Abdominal:     General: Bowel sounds are normal. There is no distension.     Palpations: Abdomen is soft. There is no mass.     Tenderness: There is no abdominal tenderness. There is no guarding or rebound.     Hernia: A hernia is present.     Comments: Reducible left inguinal hernia noted.  Week right inguinal floor.  Genitourinary:    Comments: Genitourinary examination is within normal limits. Skin:    General: Skin is warm and dry.  Neurological:     Mental Status: He is alert and oriented to person, place, and time.   Dr. Noland Fordyce notes  reviewed  Assessment  Recurrent left inguinal hernia Plan   Patient would like to wait on any hernia repair until he is more symptomatic.  I told him that was fine.  The signs and symptoms of incarceration were explained to the patient.  Literature was given.  He will call back should he have any questions or decide to have the hernia fixed.

## 2020-05-29 DIAGNOSIS — E1122 Type 2 diabetes mellitus with diabetic chronic kidney disease: Secondary | ICD-10-CM | POA: Diagnosis not present

## 2020-05-29 DIAGNOSIS — I129 Hypertensive chronic kidney disease with stage 1 through stage 4 chronic kidney disease, or unspecified chronic kidney disease: Secondary | ICD-10-CM | POA: Diagnosis not present

## 2020-05-29 DIAGNOSIS — N183 Chronic kidney disease, stage 3 unspecified: Secondary | ICD-10-CM | POA: Diagnosis not present

## 2020-06-23 DIAGNOSIS — I10 Essential (primary) hypertension: Secondary | ICD-10-CM | POA: Diagnosis not present

## 2020-06-23 DIAGNOSIS — E7849 Other hyperlipidemia: Secondary | ICD-10-CM | POA: Diagnosis not present

## 2020-06-23 DIAGNOSIS — E781 Pure hyperglyceridemia: Secondary | ICD-10-CM | POA: Diagnosis not present

## 2020-06-23 DIAGNOSIS — E1122 Type 2 diabetes mellitus with diabetic chronic kidney disease: Secondary | ICD-10-CM | POA: Diagnosis not present

## 2020-06-23 DIAGNOSIS — N183 Chronic kidney disease, stage 3 unspecified: Secondary | ICD-10-CM | POA: Diagnosis not present

## 2020-06-23 DIAGNOSIS — E1121 Type 2 diabetes mellitus with diabetic nephropathy: Secondary | ICD-10-CM | POA: Diagnosis not present

## 2020-06-23 DIAGNOSIS — E782 Mixed hyperlipidemia: Secondary | ICD-10-CM | POA: Diagnosis not present

## 2020-06-23 DIAGNOSIS — E1165 Type 2 diabetes mellitus with hyperglycemia: Secondary | ICD-10-CM | POA: Diagnosis not present

## 2020-06-26 DIAGNOSIS — E1122 Type 2 diabetes mellitus with diabetic chronic kidney disease: Secondary | ICD-10-CM | POA: Diagnosis not present

## 2020-06-26 DIAGNOSIS — E1165 Type 2 diabetes mellitus with hyperglycemia: Secondary | ICD-10-CM | POA: Diagnosis not present

## 2020-06-26 DIAGNOSIS — E7849 Other hyperlipidemia: Secondary | ICD-10-CM | POA: Diagnosis not present

## 2020-06-26 DIAGNOSIS — E1140 Type 2 diabetes mellitus with diabetic neuropathy, unspecified: Secondary | ICD-10-CM | POA: Diagnosis not present

## 2020-06-26 DIAGNOSIS — E1159 Type 2 diabetes mellitus with other circulatory complications: Secondary | ICD-10-CM | POA: Diagnosis not present

## 2020-06-26 DIAGNOSIS — I10 Essential (primary) hypertension: Secondary | ICD-10-CM | POA: Diagnosis not present

## 2020-06-26 DIAGNOSIS — Z1389 Encounter for screening for other disorder: Secondary | ICD-10-CM | POA: Diagnosis not present

## 2020-06-27 DIAGNOSIS — I129 Hypertensive chronic kidney disease with stage 1 through stage 4 chronic kidney disease, or unspecified chronic kidney disease: Secondary | ICD-10-CM | POA: Diagnosis not present

## 2020-06-27 DIAGNOSIS — N183 Chronic kidney disease, stage 3 unspecified: Secondary | ICD-10-CM | POA: Diagnosis not present

## 2020-06-27 DIAGNOSIS — E1122 Type 2 diabetes mellitus with diabetic chronic kidney disease: Secondary | ICD-10-CM | POA: Diagnosis not present

## 2020-06-27 DIAGNOSIS — E7849 Other hyperlipidemia: Secondary | ICD-10-CM | POA: Diagnosis not present

## 2020-07-27 DIAGNOSIS — N183 Chronic kidney disease, stage 3 unspecified: Secondary | ICD-10-CM | POA: Diagnosis not present

## 2020-07-27 DIAGNOSIS — I129 Hypertensive chronic kidney disease with stage 1 through stage 4 chronic kidney disease, or unspecified chronic kidney disease: Secondary | ICD-10-CM | POA: Diagnosis not present

## 2020-07-27 DIAGNOSIS — E1122 Type 2 diabetes mellitus with diabetic chronic kidney disease: Secondary | ICD-10-CM | POA: Diagnosis not present

## 2020-07-27 DIAGNOSIS — E7849 Other hyperlipidemia: Secondary | ICD-10-CM | POA: Diagnosis not present

## 2020-08-26 DIAGNOSIS — N183 Chronic kidney disease, stage 3 unspecified: Secondary | ICD-10-CM | POA: Diagnosis not present

## 2020-08-26 DIAGNOSIS — E7849 Other hyperlipidemia: Secondary | ICD-10-CM | POA: Diagnosis not present

## 2020-08-26 DIAGNOSIS — E1122 Type 2 diabetes mellitus with diabetic chronic kidney disease: Secondary | ICD-10-CM | POA: Diagnosis not present

## 2020-08-26 DIAGNOSIS — I129 Hypertensive chronic kidney disease with stage 1 through stage 4 chronic kidney disease, or unspecified chronic kidney disease: Secondary | ICD-10-CM | POA: Diagnosis not present

## 2020-09-26 DIAGNOSIS — N183 Chronic kidney disease, stage 3 unspecified: Secondary | ICD-10-CM | POA: Diagnosis not present

## 2020-09-26 DIAGNOSIS — E7849 Other hyperlipidemia: Secondary | ICD-10-CM | POA: Diagnosis not present

## 2020-09-26 DIAGNOSIS — E1122 Type 2 diabetes mellitus with diabetic chronic kidney disease: Secondary | ICD-10-CM | POA: Diagnosis not present

## 2020-09-26 DIAGNOSIS — I129 Hypertensive chronic kidney disease with stage 1 through stage 4 chronic kidney disease, or unspecified chronic kidney disease: Secondary | ICD-10-CM | POA: Diagnosis not present

## 2020-10-07 ENCOUNTER — Ambulatory Visit: Payer: Medicare Other | Admitting: Urology

## 2020-10-14 DIAGNOSIS — E7849 Other hyperlipidemia: Secondary | ICD-10-CM | POA: Diagnosis not present

## 2020-10-14 DIAGNOSIS — E782 Mixed hyperlipidemia: Secondary | ICD-10-CM | POA: Diagnosis not present

## 2020-10-14 DIAGNOSIS — I10 Essential (primary) hypertension: Secondary | ICD-10-CM | POA: Diagnosis not present

## 2020-10-14 DIAGNOSIS — E1122 Type 2 diabetes mellitus with diabetic chronic kidney disease: Secondary | ICD-10-CM | POA: Diagnosis not present

## 2020-10-20 DIAGNOSIS — E1122 Type 2 diabetes mellitus with diabetic chronic kidney disease: Secondary | ICD-10-CM | POA: Diagnosis not present

## 2020-10-20 DIAGNOSIS — E1159 Type 2 diabetes mellitus with other circulatory complications: Secondary | ICD-10-CM | POA: Diagnosis not present

## 2020-10-20 DIAGNOSIS — E1165 Type 2 diabetes mellitus with hyperglycemia: Secondary | ICD-10-CM | POA: Diagnosis not present

## 2020-10-20 DIAGNOSIS — M1000 Idiopathic gout, unspecified site: Secondary | ICD-10-CM | POA: Diagnosis not present

## 2020-10-20 DIAGNOSIS — I10 Essential (primary) hypertension: Secondary | ICD-10-CM | POA: Diagnosis not present

## 2020-10-20 DIAGNOSIS — E7849 Other hyperlipidemia: Secondary | ICD-10-CM | POA: Diagnosis not present

## 2020-10-20 DIAGNOSIS — E1140 Type 2 diabetes mellitus with diabetic neuropathy, unspecified: Secondary | ICD-10-CM | POA: Diagnosis not present

## 2020-10-26 DIAGNOSIS — N183 Chronic kidney disease, stage 3 unspecified: Secondary | ICD-10-CM | POA: Diagnosis not present

## 2020-10-26 DIAGNOSIS — E1122 Type 2 diabetes mellitus with diabetic chronic kidney disease: Secondary | ICD-10-CM | POA: Diagnosis not present

## 2020-10-26 DIAGNOSIS — E7849 Other hyperlipidemia: Secondary | ICD-10-CM | POA: Diagnosis not present

## 2020-10-26 DIAGNOSIS — I129 Hypertensive chronic kidney disease with stage 1 through stage 4 chronic kidney disease, or unspecified chronic kidney disease: Secondary | ICD-10-CM | POA: Diagnosis not present

## 2020-10-30 DIAGNOSIS — Z23 Encounter for immunization: Secondary | ICD-10-CM | POA: Diagnosis not present

## 2020-11-26 DIAGNOSIS — E1122 Type 2 diabetes mellitus with diabetic chronic kidney disease: Secondary | ICD-10-CM | POA: Diagnosis not present

## 2020-11-26 DIAGNOSIS — E7849 Other hyperlipidemia: Secondary | ICD-10-CM | POA: Diagnosis not present

## 2020-11-26 DIAGNOSIS — I129 Hypertensive chronic kidney disease with stage 1 through stage 4 chronic kidney disease, or unspecified chronic kidney disease: Secondary | ICD-10-CM | POA: Diagnosis not present

## 2020-11-26 DIAGNOSIS — N183 Chronic kidney disease, stage 3 unspecified: Secondary | ICD-10-CM | POA: Diagnosis not present

## 2020-12-07 DIAGNOSIS — I10 Essential (primary) hypertension: Secondary | ICD-10-CM | POA: Diagnosis not present

## 2020-12-07 DIAGNOSIS — Z87891 Personal history of nicotine dependence: Secondary | ICD-10-CM | POA: Diagnosis not present

## 2020-12-07 DIAGNOSIS — E119 Type 2 diabetes mellitus without complications: Secondary | ICD-10-CM | POA: Diagnosis not present

## 2020-12-07 DIAGNOSIS — T63444A Toxic effect of venom of bees, undetermined, initial encounter: Secondary | ICD-10-CM | POA: Diagnosis not present

## 2020-12-27 DIAGNOSIS — I129 Hypertensive chronic kidney disease with stage 1 through stage 4 chronic kidney disease, or unspecified chronic kidney disease: Secondary | ICD-10-CM | POA: Diagnosis not present

## 2020-12-27 DIAGNOSIS — E7849 Other hyperlipidemia: Secondary | ICD-10-CM | POA: Diagnosis not present

## 2020-12-27 DIAGNOSIS — N183 Chronic kidney disease, stage 3 unspecified: Secondary | ICD-10-CM | POA: Diagnosis not present

## 2020-12-27 DIAGNOSIS — E1122 Type 2 diabetes mellitus with diabetic chronic kidney disease: Secondary | ICD-10-CM | POA: Diagnosis not present

## 2021-02-22 DIAGNOSIS — N183 Chronic kidney disease, stage 3 unspecified: Secondary | ICD-10-CM | POA: Diagnosis not present

## 2021-02-22 DIAGNOSIS — E7849 Other hyperlipidemia: Secondary | ICD-10-CM | POA: Diagnosis not present

## 2021-02-22 DIAGNOSIS — I10 Essential (primary) hypertension: Secondary | ICD-10-CM | POA: Diagnosis not present

## 2021-02-22 DIAGNOSIS — E1122 Type 2 diabetes mellitus with diabetic chronic kidney disease: Secondary | ICD-10-CM | POA: Diagnosis not present

## 2021-02-22 DIAGNOSIS — E782 Mixed hyperlipidemia: Secondary | ICD-10-CM | POA: Diagnosis not present

## 2021-02-24 DIAGNOSIS — E7849 Other hyperlipidemia: Secondary | ICD-10-CM | POA: Diagnosis not present

## 2021-02-24 DIAGNOSIS — E1140 Type 2 diabetes mellitus with diabetic neuropathy, unspecified: Secondary | ICD-10-CM | POA: Diagnosis not present

## 2021-02-24 DIAGNOSIS — L03119 Cellulitis of unspecified part of limb: Secondary | ICD-10-CM | POA: Diagnosis not present

## 2021-02-24 DIAGNOSIS — I10 Essential (primary) hypertension: Secondary | ICD-10-CM | POA: Diagnosis not present

## 2021-02-24 DIAGNOSIS — E1122 Type 2 diabetes mellitus with diabetic chronic kidney disease: Secondary | ICD-10-CM | POA: Diagnosis not present

## 2021-02-24 DIAGNOSIS — Z23 Encounter for immunization: Secondary | ICD-10-CM | POA: Diagnosis not present

## 2021-02-24 DIAGNOSIS — E1159 Type 2 diabetes mellitus with other circulatory complications: Secondary | ICD-10-CM | POA: Diagnosis not present

## 2021-02-24 DIAGNOSIS — E1165 Type 2 diabetes mellitus with hyperglycemia: Secondary | ICD-10-CM | POA: Diagnosis not present

## 2021-02-26 DIAGNOSIS — E1122 Type 2 diabetes mellitus with diabetic chronic kidney disease: Secondary | ICD-10-CM | POA: Diagnosis not present

## 2021-02-26 DIAGNOSIS — N183 Chronic kidney disease, stage 3 unspecified: Secondary | ICD-10-CM | POA: Diagnosis not present

## 2021-02-26 DIAGNOSIS — E7849 Other hyperlipidemia: Secondary | ICD-10-CM | POA: Diagnosis not present

## 2021-02-26 DIAGNOSIS — I129 Hypertensive chronic kidney disease with stage 1 through stage 4 chronic kidney disease, or unspecified chronic kidney disease: Secondary | ICD-10-CM | POA: Diagnosis not present

## 2021-03-29 DIAGNOSIS — I129 Hypertensive chronic kidney disease with stage 1 through stage 4 chronic kidney disease, or unspecified chronic kidney disease: Secondary | ICD-10-CM | POA: Diagnosis not present

## 2021-03-29 DIAGNOSIS — E1122 Type 2 diabetes mellitus with diabetic chronic kidney disease: Secondary | ICD-10-CM | POA: Diagnosis not present

## 2021-03-29 DIAGNOSIS — N183 Chronic kidney disease, stage 3 unspecified: Secondary | ICD-10-CM | POA: Diagnosis not present

## 2021-03-29 DIAGNOSIS — E7849 Other hyperlipidemia: Secondary | ICD-10-CM | POA: Diagnosis not present

## 2021-04-28 DIAGNOSIS — E1122 Type 2 diabetes mellitus with diabetic chronic kidney disease: Secondary | ICD-10-CM | POA: Diagnosis not present

## 2021-04-28 DIAGNOSIS — I129 Hypertensive chronic kidney disease with stage 1 through stage 4 chronic kidney disease, or unspecified chronic kidney disease: Secondary | ICD-10-CM | POA: Diagnosis not present

## 2021-04-28 DIAGNOSIS — N183 Chronic kidney disease, stage 3 unspecified: Secondary | ICD-10-CM | POA: Diagnosis not present

## 2021-04-28 DIAGNOSIS — E7849 Other hyperlipidemia: Secondary | ICD-10-CM | POA: Diagnosis not present

## 2021-05-28 DIAGNOSIS — N183 Chronic kidney disease, stage 3 unspecified: Secondary | ICD-10-CM | POA: Diagnosis not present

## 2021-05-28 DIAGNOSIS — E7849 Other hyperlipidemia: Secondary | ICD-10-CM | POA: Diagnosis not present

## 2021-05-28 DIAGNOSIS — E1122 Type 2 diabetes mellitus with diabetic chronic kidney disease: Secondary | ICD-10-CM | POA: Diagnosis not present

## 2021-05-28 DIAGNOSIS — I129 Hypertensive chronic kidney disease with stage 1 through stage 4 chronic kidney disease, or unspecified chronic kidney disease: Secondary | ICD-10-CM | POA: Diagnosis not present

## 2021-06-04 DIAGNOSIS — L0390 Cellulitis, unspecified: Secondary | ICD-10-CM | POA: Diagnosis not present

## 2021-06-09 DIAGNOSIS — L0390 Cellulitis, unspecified: Secondary | ICD-10-CM | POA: Diagnosis not present

## 2021-06-16 DIAGNOSIS — L0390 Cellulitis, unspecified: Secondary | ICD-10-CM | POA: Diagnosis not present

## 2021-06-28 DIAGNOSIS — I10 Essential (primary) hypertension: Secondary | ICD-10-CM | POA: Diagnosis not present

## 2021-06-28 DIAGNOSIS — E782 Mixed hyperlipidemia: Secondary | ICD-10-CM | POA: Diagnosis not present

## 2021-06-28 DIAGNOSIS — E781 Pure hyperglyceridemia: Secondary | ICD-10-CM | POA: Diagnosis not present

## 2021-06-28 DIAGNOSIS — E1165 Type 2 diabetes mellitus with hyperglycemia: Secondary | ICD-10-CM | POA: Diagnosis not present

## 2021-06-28 DIAGNOSIS — E1142 Type 2 diabetes mellitus with diabetic polyneuropathy: Secondary | ICD-10-CM | POA: Diagnosis not present

## 2021-06-28 DIAGNOSIS — E1122 Type 2 diabetes mellitus with diabetic chronic kidney disease: Secondary | ICD-10-CM | POA: Diagnosis not present

## 2021-06-28 DIAGNOSIS — E7849 Other hyperlipidemia: Secondary | ICD-10-CM | POA: Diagnosis not present

## 2021-06-30 DIAGNOSIS — Z1389 Encounter for screening for other disorder: Secondary | ICD-10-CM | POA: Diagnosis not present

## 2021-06-30 DIAGNOSIS — M1000 Idiopathic gout, unspecified site: Secondary | ICD-10-CM | POA: Diagnosis not present

## 2021-06-30 DIAGNOSIS — Z23 Encounter for immunization: Secondary | ICD-10-CM | POA: Diagnosis not present

## 2021-06-30 DIAGNOSIS — L03119 Cellulitis of unspecified part of limb: Secondary | ICD-10-CM | POA: Diagnosis not present

## 2021-06-30 DIAGNOSIS — E1165 Type 2 diabetes mellitus with hyperglycemia: Secondary | ICD-10-CM | POA: Diagnosis not present

## 2021-06-30 DIAGNOSIS — E1159 Type 2 diabetes mellitus with other circulatory complications: Secondary | ICD-10-CM | POA: Diagnosis not present

## 2021-06-30 DIAGNOSIS — I10 Essential (primary) hypertension: Secondary | ICD-10-CM | POA: Diagnosis not present

## 2021-06-30 DIAGNOSIS — E1122 Type 2 diabetes mellitus with diabetic chronic kidney disease: Secondary | ICD-10-CM | POA: Diagnosis not present

## 2021-06-30 DIAGNOSIS — E782 Mixed hyperlipidemia: Secondary | ICD-10-CM | POA: Diagnosis not present

## 2021-08-03 DIAGNOSIS — L03116 Cellulitis of left lower limb: Secondary | ICD-10-CM | POA: Diagnosis not present

## 2021-08-03 DIAGNOSIS — I878 Other specified disorders of veins: Secondary | ICD-10-CM | POA: Diagnosis not present

## 2021-08-03 DIAGNOSIS — M109 Gout, unspecified: Secondary | ICD-10-CM | POA: Diagnosis not present

## 2021-08-03 DIAGNOSIS — L03115 Cellulitis of right lower limb: Secondary | ICD-10-CM | POA: Diagnosis not present

## 2021-08-03 DIAGNOSIS — L97929 Non-pressure chronic ulcer of unspecified part of left lower leg with unspecified severity: Secondary | ICD-10-CM | POA: Diagnosis not present

## 2021-08-03 DIAGNOSIS — E11622 Type 2 diabetes mellitus with other skin ulcer: Secondary | ICD-10-CM | POA: Diagnosis not present

## 2021-08-03 DIAGNOSIS — E7800 Pure hypercholesterolemia, unspecified: Secondary | ICD-10-CM | POA: Diagnosis not present

## 2021-08-03 DIAGNOSIS — I272 Pulmonary hypertension, unspecified: Secondary | ICD-10-CM | POA: Diagnosis not present

## 2021-08-03 DIAGNOSIS — I10 Essential (primary) hypertension: Secondary | ICD-10-CM | POA: Diagnosis not present

## 2021-08-03 DIAGNOSIS — Z87891 Personal history of nicotine dependence: Secondary | ICD-10-CM | POA: Diagnosis not present

## 2021-08-03 DIAGNOSIS — L97919 Non-pressure chronic ulcer of unspecified part of right lower leg with unspecified severity: Secondary | ICD-10-CM | POA: Diagnosis not present

## 2021-08-03 DIAGNOSIS — Z872 Personal history of diseases of the skin and subcutaneous tissue: Secondary | ICD-10-CM | POA: Diagnosis not present

## 2021-08-03 DIAGNOSIS — Z7984 Long term (current) use of oral hypoglycemic drugs: Secondary | ICD-10-CM | POA: Diagnosis not present

## 2021-08-03 DIAGNOSIS — M7989 Other specified soft tissue disorders: Secondary | ICD-10-CM | POA: Diagnosis not present

## 2021-08-03 DIAGNOSIS — M79604 Pain in right leg: Secondary | ICD-10-CM | POA: Diagnosis not present

## 2021-08-03 DIAGNOSIS — I872 Venous insufficiency (chronic) (peripheral): Secondary | ICD-10-CM | POA: Diagnosis not present

## 2021-08-03 DIAGNOSIS — Z79899 Other long term (current) drug therapy: Secondary | ICD-10-CM | POA: Diagnosis not present

## 2021-08-05 DIAGNOSIS — Z7984 Long term (current) use of oral hypoglycemic drugs: Secondary | ICD-10-CM | POA: Diagnosis not present

## 2021-08-05 DIAGNOSIS — I878 Other specified disorders of veins: Secondary | ICD-10-CM | POA: Diagnosis not present

## 2021-08-05 DIAGNOSIS — M7989 Other specified soft tissue disorders: Secondary | ICD-10-CM | POA: Diagnosis not present

## 2021-08-05 DIAGNOSIS — L03115 Cellulitis of right lower limb: Secondary | ICD-10-CM | POA: Diagnosis not present

## 2021-08-05 DIAGNOSIS — I10 Essential (primary) hypertension: Secondary | ICD-10-CM | POA: Diagnosis not present

## 2021-08-05 DIAGNOSIS — Z79899 Other long term (current) drug therapy: Secondary | ICD-10-CM | POA: Diagnosis not present

## 2021-08-05 DIAGNOSIS — E11622 Type 2 diabetes mellitus with other skin ulcer: Secondary | ICD-10-CM | POA: Diagnosis not present

## 2021-08-05 DIAGNOSIS — L03116 Cellulitis of left lower limb: Secondary | ICD-10-CM | POA: Diagnosis not present

## 2021-08-05 DIAGNOSIS — L97929 Non-pressure chronic ulcer of unspecified part of left lower leg with unspecified severity: Secondary | ICD-10-CM | POA: Diagnosis not present

## 2021-08-05 DIAGNOSIS — Z87891 Personal history of nicotine dependence: Secondary | ICD-10-CM | POA: Diagnosis not present

## 2021-08-05 DIAGNOSIS — I272 Pulmonary hypertension, unspecified: Secondary | ICD-10-CM | POA: Diagnosis not present

## 2021-08-05 DIAGNOSIS — I872 Venous insufficiency (chronic) (peripheral): Secondary | ICD-10-CM | POA: Diagnosis not present

## 2021-08-05 DIAGNOSIS — E7800 Pure hypercholesterolemia, unspecified: Secondary | ICD-10-CM | POA: Diagnosis not present

## 2021-08-05 DIAGNOSIS — M109 Gout, unspecified: Secondary | ICD-10-CM | POA: Diagnosis not present

## 2021-08-05 DIAGNOSIS — L97919 Non-pressure chronic ulcer of unspecified part of right lower leg with unspecified severity: Secondary | ICD-10-CM | POA: Diagnosis not present

## 2021-08-06 DIAGNOSIS — H905 Unspecified sensorineural hearing loss: Secondary | ICD-10-CM | POA: Diagnosis not present

## 2021-08-09 DIAGNOSIS — L03116 Cellulitis of left lower limb: Secondary | ICD-10-CM | POA: Diagnosis not present

## 2021-08-09 DIAGNOSIS — L97929 Non-pressure chronic ulcer of unspecified part of left lower leg with unspecified severity: Secondary | ICD-10-CM | POA: Diagnosis not present

## 2021-08-09 DIAGNOSIS — E7800 Pure hypercholesterolemia, unspecified: Secondary | ICD-10-CM | POA: Diagnosis not present

## 2021-08-09 DIAGNOSIS — I10 Essential (primary) hypertension: Secondary | ICD-10-CM | POA: Diagnosis not present

## 2021-08-09 DIAGNOSIS — Z79899 Other long term (current) drug therapy: Secondary | ICD-10-CM | POA: Diagnosis not present

## 2021-08-09 DIAGNOSIS — Z87891 Personal history of nicotine dependence: Secondary | ICD-10-CM | POA: Diagnosis not present

## 2021-08-09 DIAGNOSIS — M109 Gout, unspecified: Secondary | ICD-10-CM | POA: Diagnosis not present

## 2021-08-09 DIAGNOSIS — M7989 Other specified soft tissue disorders: Secondary | ICD-10-CM | POA: Diagnosis not present

## 2021-08-09 DIAGNOSIS — L03115 Cellulitis of right lower limb: Secondary | ICD-10-CM | POA: Diagnosis not present

## 2021-08-09 DIAGNOSIS — E11622 Type 2 diabetes mellitus with other skin ulcer: Secondary | ICD-10-CM | POA: Diagnosis not present

## 2021-08-09 DIAGNOSIS — L97919 Non-pressure chronic ulcer of unspecified part of right lower leg with unspecified severity: Secondary | ICD-10-CM | POA: Diagnosis not present

## 2021-08-09 DIAGNOSIS — Z7984 Long term (current) use of oral hypoglycemic drugs: Secondary | ICD-10-CM | POA: Diagnosis not present

## 2021-08-09 DIAGNOSIS — I878 Other specified disorders of veins: Secondary | ICD-10-CM | POA: Diagnosis not present

## 2021-08-09 DIAGNOSIS — I272 Pulmonary hypertension, unspecified: Secondary | ICD-10-CM | POA: Diagnosis not present

## 2021-08-09 DIAGNOSIS — I872 Venous insufficiency (chronic) (peripheral): Secondary | ICD-10-CM | POA: Diagnosis not present

## 2021-08-11 DIAGNOSIS — Z79899 Other long term (current) drug therapy: Secondary | ICD-10-CM | POA: Diagnosis not present

## 2021-08-11 DIAGNOSIS — I272 Pulmonary hypertension, unspecified: Secondary | ICD-10-CM | POA: Diagnosis not present

## 2021-08-11 DIAGNOSIS — I878 Other specified disorders of veins: Secondary | ICD-10-CM | POA: Diagnosis not present

## 2021-08-11 DIAGNOSIS — L97919 Non-pressure chronic ulcer of unspecified part of right lower leg with unspecified severity: Secondary | ICD-10-CM | POA: Diagnosis not present

## 2021-08-11 DIAGNOSIS — Z87891 Personal history of nicotine dependence: Secondary | ICD-10-CM | POA: Diagnosis not present

## 2021-08-11 DIAGNOSIS — I872 Venous insufficiency (chronic) (peripheral): Secondary | ICD-10-CM | POA: Diagnosis not present

## 2021-08-11 DIAGNOSIS — M109 Gout, unspecified: Secondary | ICD-10-CM | POA: Diagnosis not present

## 2021-08-11 DIAGNOSIS — E11622 Type 2 diabetes mellitus with other skin ulcer: Secondary | ICD-10-CM | POA: Diagnosis not present

## 2021-08-11 DIAGNOSIS — Z7984 Long term (current) use of oral hypoglycemic drugs: Secondary | ICD-10-CM | POA: Diagnosis not present

## 2021-08-11 DIAGNOSIS — M7989 Other specified soft tissue disorders: Secondary | ICD-10-CM | POA: Diagnosis not present

## 2021-08-11 DIAGNOSIS — E7800 Pure hypercholesterolemia, unspecified: Secondary | ICD-10-CM | POA: Diagnosis not present

## 2021-08-11 DIAGNOSIS — L97929 Non-pressure chronic ulcer of unspecified part of left lower leg with unspecified severity: Secondary | ICD-10-CM | POA: Diagnosis not present

## 2021-08-11 DIAGNOSIS — L03116 Cellulitis of left lower limb: Secondary | ICD-10-CM | POA: Diagnosis not present

## 2021-08-11 DIAGNOSIS — L03115 Cellulitis of right lower limb: Secondary | ICD-10-CM | POA: Diagnosis not present

## 2021-08-11 DIAGNOSIS — I10 Essential (primary) hypertension: Secondary | ICD-10-CM | POA: Diagnosis not present

## 2021-08-17 DIAGNOSIS — I10 Essential (primary) hypertension: Secondary | ICD-10-CM | POA: Diagnosis not present

## 2021-08-17 DIAGNOSIS — M7989 Other specified soft tissue disorders: Secondary | ICD-10-CM | POA: Diagnosis not present

## 2021-08-17 DIAGNOSIS — E7800 Pure hypercholesterolemia, unspecified: Secondary | ICD-10-CM | POA: Diagnosis not present

## 2021-08-17 DIAGNOSIS — Z79899 Other long term (current) drug therapy: Secondary | ICD-10-CM | POA: Diagnosis not present

## 2021-08-17 DIAGNOSIS — L97919 Non-pressure chronic ulcer of unspecified part of right lower leg with unspecified severity: Secondary | ICD-10-CM | POA: Diagnosis not present

## 2021-08-17 DIAGNOSIS — M109 Gout, unspecified: Secondary | ICD-10-CM | POA: Diagnosis not present

## 2021-08-17 DIAGNOSIS — I872 Venous insufficiency (chronic) (peripheral): Secondary | ICD-10-CM | POA: Diagnosis not present

## 2021-08-17 DIAGNOSIS — I272 Pulmonary hypertension, unspecified: Secondary | ICD-10-CM | POA: Diagnosis not present

## 2021-08-17 DIAGNOSIS — L03115 Cellulitis of right lower limb: Secondary | ICD-10-CM | POA: Diagnosis not present

## 2021-08-17 DIAGNOSIS — L03116 Cellulitis of left lower limb: Secondary | ICD-10-CM | POA: Diagnosis not present

## 2021-08-17 DIAGNOSIS — Z7984 Long term (current) use of oral hypoglycemic drugs: Secondary | ICD-10-CM | POA: Diagnosis not present

## 2021-08-17 DIAGNOSIS — E11622 Type 2 diabetes mellitus with other skin ulcer: Secondary | ICD-10-CM | POA: Diagnosis not present

## 2021-08-17 DIAGNOSIS — Z87891 Personal history of nicotine dependence: Secondary | ICD-10-CM | POA: Diagnosis not present

## 2021-08-17 DIAGNOSIS — I878 Other specified disorders of veins: Secondary | ICD-10-CM | POA: Diagnosis not present

## 2021-08-17 DIAGNOSIS — L97929 Non-pressure chronic ulcer of unspecified part of left lower leg with unspecified severity: Secondary | ICD-10-CM | POA: Diagnosis not present

## 2021-08-31 DIAGNOSIS — E11622 Type 2 diabetes mellitus with other skin ulcer: Secondary | ICD-10-CM | POA: Diagnosis not present

## 2021-08-31 DIAGNOSIS — Z87891 Personal history of nicotine dependence: Secondary | ICD-10-CM | POA: Diagnosis not present

## 2021-08-31 DIAGNOSIS — L97929 Non-pressure chronic ulcer of unspecified part of left lower leg with unspecified severity: Secondary | ICD-10-CM | POA: Diagnosis not present

## 2021-08-31 DIAGNOSIS — Z7984 Long term (current) use of oral hypoglycemic drugs: Secondary | ICD-10-CM | POA: Diagnosis not present

## 2021-08-31 DIAGNOSIS — L97919 Non-pressure chronic ulcer of unspecified part of right lower leg with unspecified severity: Secondary | ICD-10-CM | POA: Diagnosis not present

## 2021-08-31 DIAGNOSIS — L03116 Cellulitis of left lower limb: Secondary | ICD-10-CM | POA: Diagnosis not present

## 2021-08-31 DIAGNOSIS — M109 Gout, unspecified: Secondary | ICD-10-CM | POA: Diagnosis not present

## 2021-08-31 DIAGNOSIS — I10 Essential (primary) hypertension: Secondary | ICD-10-CM | POA: Diagnosis not present

## 2021-08-31 DIAGNOSIS — L03115 Cellulitis of right lower limb: Secondary | ICD-10-CM | POA: Diagnosis not present

## 2021-08-31 DIAGNOSIS — I872 Venous insufficiency (chronic) (peripheral): Secondary | ICD-10-CM | POA: Diagnosis not present

## 2021-08-31 DIAGNOSIS — Z79899 Other long term (current) drug therapy: Secondary | ICD-10-CM | POA: Diagnosis not present

## 2021-08-31 DIAGNOSIS — E7800 Pure hypercholesterolemia, unspecified: Secondary | ICD-10-CM | POA: Diagnosis not present

## 2021-08-31 DIAGNOSIS — M7989 Other specified soft tissue disorders: Secondary | ICD-10-CM | POA: Diagnosis not present

## 2021-08-31 DIAGNOSIS — I272 Pulmonary hypertension, unspecified: Secondary | ICD-10-CM | POA: Diagnosis not present

## 2021-08-31 DIAGNOSIS — I878 Other specified disorders of veins: Secondary | ICD-10-CM | POA: Diagnosis not present

## 2021-09-01 DIAGNOSIS — I890 Lymphedema, not elsewhere classified: Secondary | ICD-10-CM | POA: Diagnosis not present

## 2021-09-01 DIAGNOSIS — L97919 Non-pressure chronic ulcer of unspecified part of right lower leg with unspecified severity: Secondary | ICD-10-CM | POA: Diagnosis not present

## 2021-09-01 DIAGNOSIS — L97929 Non-pressure chronic ulcer of unspecified part of left lower leg with unspecified severity: Secondary | ICD-10-CM | POA: Diagnosis not present

## 2021-09-06 DIAGNOSIS — L97929 Non-pressure chronic ulcer of unspecified part of left lower leg with unspecified severity: Secondary | ICD-10-CM | POA: Diagnosis not present

## 2021-09-06 DIAGNOSIS — I890 Lymphedema, not elsewhere classified: Secondary | ICD-10-CM | POA: Diagnosis not present

## 2021-09-06 DIAGNOSIS — L97919 Non-pressure chronic ulcer of unspecified part of right lower leg with unspecified severity: Secondary | ICD-10-CM | POA: Diagnosis not present

## 2021-09-08 DIAGNOSIS — L97929 Non-pressure chronic ulcer of unspecified part of left lower leg with unspecified severity: Secondary | ICD-10-CM | POA: Diagnosis not present

## 2021-09-08 DIAGNOSIS — I890 Lymphedema, not elsewhere classified: Secondary | ICD-10-CM | POA: Diagnosis not present

## 2021-09-08 DIAGNOSIS — L97919 Non-pressure chronic ulcer of unspecified part of right lower leg with unspecified severity: Secondary | ICD-10-CM | POA: Diagnosis not present

## 2021-09-13 DIAGNOSIS — I890 Lymphedema, not elsewhere classified: Secondary | ICD-10-CM | POA: Diagnosis not present

## 2021-09-13 DIAGNOSIS — L97929 Non-pressure chronic ulcer of unspecified part of left lower leg with unspecified severity: Secondary | ICD-10-CM | POA: Diagnosis not present

## 2021-09-13 DIAGNOSIS — L97919 Non-pressure chronic ulcer of unspecified part of right lower leg with unspecified severity: Secondary | ICD-10-CM | POA: Diagnosis not present

## 2021-09-15 DIAGNOSIS — I890 Lymphedema, not elsewhere classified: Secondary | ICD-10-CM | POA: Diagnosis not present

## 2021-09-15 DIAGNOSIS — L97919 Non-pressure chronic ulcer of unspecified part of right lower leg with unspecified severity: Secondary | ICD-10-CM | POA: Diagnosis not present

## 2021-09-15 DIAGNOSIS — L97929 Non-pressure chronic ulcer of unspecified part of left lower leg with unspecified severity: Secondary | ICD-10-CM | POA: Diagnosis not present

## 2021-09-22 DIAGNOSIS — I890 Lymphedema, not elsewhere classified: Secondary | ICD-10-CM | POA: Diagnosis not present

## 2021-09-22 DIAGNOSIS — L97929 Non-pressure chronic ulcer of unspecified part of left lower leg with unspecified severity: Secondary | ICD-10-CM | POA: Diagnosis not present

## 2021-09-22 DIAGNOSIS — L97919 Non-pressure chronic ulcer of unspecified part of right lower leg with unspecified severity: Secondary | ICD-10-CM | POA: Diagnosis not present

## 2021-09-27 DIAGNOSIS — Z79899 Other long term (current) drug therapy: Secondary | ICD-10-CM | POA: Diagnosis not present

## 2021-09-27 DIAGNOSIS — Z7984 Long term (current) use of oral hypoglycemic drugs: Secondary | ICD-10-CM | POA: Diagnosis not present

## 2021-09-27 DIAGNOSIS — I10 Essential (primary) hypertension: Secondary | ICD-10-CM | POA: Diagnosis not present

## 2021-09-27 DIAGNOSIS — R6889 Other general symptoms and signs: Secondary | ICD-10-CM | POA: Diagnosis not present

## 2021-09-27 DIAGNOSIS — M5441 Lumbago with sciatica, right side: Secondary | ICD-10-CM | POA: Diagnosis not present

## 2021-09-27 DIAGNOSIS — E119 Type 2 diabetes mellitus without complications: Secondary | ICD-10-CM | POA: Diagnosis not present

## 2021-09-27 DIAGNOSIS — M5136 Other intervertebral disc degeneration, lumbar region: Secondary | ICD-10-CM | POA: Diagnosis not present

## 2021-09-27 DIAGNOSIS — E7800 Pure hypercholesterolemia, unspecified: Secondary | ICD-10-CM | POA: Diagnosis not present

## 2021-09-27 DIAGNOSIS — Z87891 Personal history of nicotine dependence: Secondary | ICD-10-CM | POA: Diagnosis not present

## 2021-09-27 DIAGNOSIS — M8588 Other specified disorders of bone density and structure, other site: Secondary | ICD-10-CM | POA: Diagnosis not present

## 2021-09-27 DIAGNOSIS — M545 Low back pain, unspecified: Secondary | ICD-10-CM | POA: Diagnosis not present

## 2021-09-27 DIAGNOSIS — M5442 Lumbago with sciatica, left side: Secondary | ICD-10-CM | POA: Diagnosis not present

## 2021-09-27 DIAGNOSIS — Z743 Need for continuous supervision: Secondary | ICD-10-CM | POA: Diagnosis not present

## 2021-09-29 DIAGNOSIS — I890 Lymphedema, not elsewhere classified: Secondary | ICD-10-CM | POA: Diagnosis not present

## 2021-09-29 DIAGNOSIS — L97929 Non-pressure chronic ulcer of unspecified part of left lower leg with unspecified severity: Secondary | ICD-10-CM | POA: Diagnosis not present

## 2021-09-29 DIAGNOSIS — L97919 Non-pressure chronic ulcer of unspecified part of right lower leg with unspecified severity: Secondary | ICD-10-CM | POA: Diagnosis not present

## 2021-09-30 DIAGNOSIS — Z1212 Encounter for screening for malignant neoplasm of rectum: Secondary | ICD-10-CM | POA: Diagnosis not present

## 2021-09-30 DIAGNOSIS — Z1211 Encounter for screening for malignant neoplasm of colon: Secondary | ICD-10-CM | POA: Diagnosis not present

## 2021-10-06 DIAGNOSIS — L97919 Non-pressure chronic ulcer of unspecified part of right lower leg with unspecified severity: Secondary | ICD-10-CM | POA: Diagnosis not present

## 2021-10-06 DIAGNOSIS — I890 Lymphedema, not elsewhere classified: Secondary | ICD-10-CM | POA: Diagnosis not present

## 2021-10-06 DIAGNOSIS — L97929 Non-pressure chronic ulcer of unspecified part of left lower leg with unspecified severity: Secondary | ICD-10-CM | POA: Diagnosis not present

## 2021-10-11 DIAGNOSIS — I890 Lymphedema, not elsewhere classified: Secondary | ICD-10-CM | POA: Diagnosis not present

## 2021-10-11 DIAGNOSIS — L97919 Non-pressure chronic ulcer of unspecified part of right lower leg with unspecified severity: Secondary | ICD-10-CM | POA: Diagnosis not present

## 2021-10-11 DIAGNOSIS — L97929 Non-pressure chronic ulcer of unspecified part of left lower leg with unspecified severity: Secondary | ICD-10-CM | POA: Diagnosis not present

## 2021-10-13 DIAGNOSIS — I890 Lymphedema, not elsewhere classified: Secondary | ICD-10-CM | POA: Diagnosis not present

## 2021-10-13 DIAGNOSIS — L97919 Non-pressure chronic ulcer of unspecified part of right lower leg with unspecified severity: Secondary | ICD-10-CM | POA: Diagnosis not present

## 2021-10-13 DIAGNOSIS — L97929 Non-pressure chronic ulcer of unspecified part of left lower leg with unspecified severity: Secondary | ICD-10-CM | POA: Diagnosis not present

## 2021-10-21 DIAGNOSIS — I10 Essential (primary) hypertension: Secondary | ICD-10-CM | POA: Diagnosis not present

## 2021-10-21 DIAGNOSIS — N183 Chronic kidney disease, stage 3 unspecified: Secondary | ICD-10-CM | POA: Diagnosis not present

## 2021-10-21 DIAGNOSIS — E782 Mixed hyperlipidemia: Secondary | ICD-10-CM | POA: Diagnosis not present

## 2021-10-21 DIAGNOSIS — E119 Type 2 diabetes mellitus without complications: Secondary | ICD-10-CM | POA: Diagnosis not present

## 2021-10-21 DIAGNOSIS — E7849 Other hyperlipidemia: Secondary | ICD-10-CM | POA: Diagnosis not present

## 2021-10-28 DIAGNOSIS — E1122 Type 2 diabetes mellitus with diabetic chronic kidney disease: Secondary | ICD-10-CM | POA: Diagnosis not present

## 2021-10-28 DIAGNOSIS — I10 Essential (primary) hypertension: Secondary | ICD-10-CM | POA: Diagnosis not present

## 2021-10-28 DIAGNOSIS — E1140 Type 2 diabetes mellitus with diabetic neuropathy, unspecified: Secondary | ICD-10-CM | POA: Diagnosis not present

## 2021-10-28 DIAGNOSIS — L03119 Cellulitis of unspecified part of limb: Secondary | ICD-10-CM | POA: Diagnosis not present

## 2021-10-28 DIAGNOSIS — Z23 Encounter for immunization: Secondary | ICD-10-CM | POA: Diagnosis not present

## 2021-10-28 DIAGNOSIS — E7849 Other hyperlipidemia: Secondary | ICD-10-CM | POA: Diagnosis not present

## 2021-10-28 DIAGNOSIS — E1159 Type 2 diabetes mellitus with other circulatory complications: Secondary | ICD-10-CM | POA: Diagnosis not present

## 2021-10-28 DIAGNOSIS — E1165 Type 2 diabetes mellitus with hyperglycemia: Secondary | ICD-10-CM | POA: Diagnosis not present

## 2021-10-28 DIAGNOSIS — M1000 Idiopathic gout, unspecified site: Secondary | ICD-10-CM | POA: Diagnosis not present

## 2021-12-10 DIAGNOSIS — M109 Gout, unspecified: Secondary | ICD-10-CM | POA: Diagnosis not present

## 2021-12-10 DIAGNOSIS — I10 Essential (primary) hypertension: Secondary | ICD-10-CM | POA: Diagnosis not present

## 2021-12-10 DIAGNOSIS — Z79899 Other long term (current) drug therapy: Secondary | ICD-10-CM | POA: Diagnosis not present

## 2021-12-10 DIAGNOSIS — Z86010 Personal history of colonic polyps: Secondary | ICD-10-CM | POA: Diagnosis not present

## 2021-12-10 DIAGNOSIS — Z87891 Personal history of nicotine dependence: Secondary | ICD-10-CM | POA: Diagnosis not present

## 2021-12-10 DIAGNOSIS — E7800 Pure hypercholesterolemia, unspecified: Secondary | ICD-10-CM | POA: Diagnosis not present

## 2021-12-10 DIAGNOSIS — E119 Type 2 diabetes mellitus without complications: Secondary | ICD-10-CM | POA: Diagnosis not present

## 2021-12-10 DIAGNOSIS — Z1211 Encounter for screening for malignant neoplasm of colon: Secondary | ICD-10-CM | POA: Diagnosis not present

## 2021-12-10 DIAGNOSIS — K5900 Constipation, unspecified: Secondary | ICD-10-CM | POA: Diagnosis not present

## 2021-12-10 DIAGNOSIS — Z7984 Long term (current) use of oral hypoglycemic drugs: Secondary | ICD-10-CM | POA: Diagnosis not present

## 2021-12-10 DIAGNOSIS — K5730 Diverticulosis of large intestine without perforation or abscess without bleeding: Secondary | ICD-10-CM | POA: Diagnosis not present

## 2022-02-16 DIAGNOSIS — Z23 Encounter for immunization: Secondary | ICD-10-CM | POA: Diagnosis not present

## 2022-02-28 DIAGNOSIS — E7849 Other hyperlipidemia: Secondary | ICD-10-CM | POA: Diagnosis not present

## 2022-02-28 DIAGNOSIS — I10 Essential (primary) hypertension: Secondary | ICD-10-CM | POA: Diagnosis not present

## 2022-02-28 DIAGNOSIS — N183 Chronic kidney disease, stage 3 unspecified: Secondary | ICD-10-CM | POA: Diagnosis not present

## 2022-02-28 DIAGNOSIS — E119 Type 2 diabetes mellitus without complications: Secondary | ICD-10-CM | POA: Diagnosis not present

## 2022-03-07 DIAGNOSIS — E1140 Type 2 diabetes mellitus with diabetic neuropathy, unspecified: Secondary | ICD-10-CM | POA: Diagnosis not present

## 2022-03-07 DIAGNOSIS — I10 Essential (primary) hypertension: Secondary | ICD-10-CM | POA: Diagnosis not present

## 2022-03-07 DIAGNOSIS — E7849 Other hyperlipidemia: Secondary | ICD-10-CM | POA: Diagnosis not present

## 2022-03-07 DIAGNOSIS — M1000 Idiopathic gout, unspecified site: Secondary | ICD-10-CM | POA: Diagnosis not present

## 2022-03-07 DIAGNOSIS — E1122 Type 2 diabetes mellitus with diabetic chronic kidney disease: Secondary | ICD-10-CM | POA: Diagnosis not present

## 2022-03-07 DIAGNOSIS — E1159 Type 2 diabetes mellitus with other circulatory complications: Secondary | ICD-10-CM | POA: Diagnosis not present

## 2022-03-07 DIAGNOSIS — E1165 Type 2 diabetes mellitus with hyperglycemia: Secondary | ICD-10-CM | POA: Diagnosis not present

## 2022-06-24 DIAGNOSIS — E1140 Type 2 diabetes mellitus with diabetic neuropathy, unspecified: Secondary | ICD-10-CM | POA: Diagnosis not present

## 2022-06-24 DIAGNOSIS — E1165 Type 2 diabetes mellitus with hyperglycemia: Secondary | ICD-10-CM | POA: Diagnosis not present

## 2022-07-01 DIAGNOSIS — I10 Essential (primary) hypertension: Secondary | ICD-10-CM | POA: Diagnosis not present

## 2022-07-01 DIAGNOSIS — E7849 Other hyperlipidemia: Secondary | ICD-10-CM | POA: Diagnosis not present

## 2022-07-01 DIAGNOSIS — E1122 Type 2 diabetes mellitus with diabetic chronic kidney disease: Secondary | ICD-10-CM | POA: Diagnosis not present

## 2022-07-01 DIAGNOSIS — N183 Chronic kidney disease, stage 3 unspecified: Secondary | ICD-10-CM | POA: Diagnosis not present

## 2022-07-01 DIAGNOSIS — E1165 Type 2 diabetes mellitus with hyperglycemia: Secondary | ICD-10-CM | POA: Diagnosis not present

## 2022-07-01 DIAGNOSIS — E782 Mixed hyperlipidemia: Secondary | ICD-10-CM | POA: Diagnosis not present

## 2022-07-01 DIAGNOSIS — E1159 Type 2 diabetes mellitus with other circulatory complications: Secondary | ICD-10-CM | POA: Diagnosis not present

## 2022-07-01 DIAGNOSIS — E1121 Type 2 diabetes mellitus with diabetic nephropathy: Secondary | ICD-10-CM | POA: Diagnosis not present

## 2022-07-08 DIAGNOSIS — E7849 Other hyperlipidemia: Secondary | ICD-10-CM | POA: Diagnosis not present

## 2022-07-08 DIAGNOSIS — E1165 Type 2 diabetes mellitus with hyperglycemia: Secondary | ICD-10-CM | POA: Diagnosis not present

## 2022-07-08 DIAGNOSIS — I10 Essential (primary) hypertension: Secondary | ICD-10-CM | POA: Diagnosis not present

## 2022-07-08 DIAGNOSIS — E1159 Type 2 diabetes mellitus with other circulatory complications: Secondary | ICD-10-CM | POA: Diagnosis not present

## 2022-07-08 DIAGNOSIS — Z23 Encounter for immunization: Secondary | ICD-10-CM | POA: Diagnosis not present

## 2022-07-08 DIAGNOSIS — E1140 Type 2 diabetes mellitus with diabetic neuropathy, unspecified: Secondary | ICD-10-CM | POA: Diagnosis not present

## 2022-07-08 DIAGNOSIS — M1000 Idiopathic gout, unspecified site: Secondary | ICD-10-CM | POA: Diagnosis not present

## 2022-07-08 DIAGNOSIS — E1122 Type 2 diabetes mellitus with diabetic chronic kidney disease: Secondary | ICD-10-CM | POA: Diagnosis not present

## 2022-07-24 DIAGNOSIS — E1165 Type 2 diabetes mellitus with hyperglycemia: Secondary | ICD-10-CM | POA: Diagnosis not present

## 2022-08-23 DIAGNOSIS — E1165 Type 2 diabetes mellitus with hyperglycemia: Secondary | ICD-10-CM | POA: Diagnosis not present

## 2022-09-07 DIAGNOSIS — E119 Type 2 diabetes mellitus without complications: Secondary | ICD-10-CM | POA: Diagnosis not present

## 2022-09-07 DIAGNOSIS — I10 Essential (primary) hypertension: Secondary | ICD-10-CM | POA: Diagnosis not present

## 2022-09-13 DIAGNOSIS — H905 Unspecified sensorineural hearing loss: Secondary | ICD-10-CM | POA: Diagnosis not present

## 2022-09-15 DIAGNOSIS — I10 Essential (primary) hypertension: Secondary | ICD-10-CM | POA: Diagnosis not present

## 2022-09-15 DIAGNOSIS — E119 Type 2 diabetes mellitus without complications: Secondary | ICD-10-CM | POA: Diagnosis not present

## 2022-09-26 DIAGNOSIS — E1140 Type 2 diabetes mellitus with diabetic neuropathy, unspecified: Secondary | ICD-10-CM | POA: Diagnosis not present

## 2022-09-26 DIAGNOSIS — E1165 Type 2 diabetes mellitus with hyperglycemia: Secondary | ICD-10-CM | POA: Diagnosis not present

## 2022-09-27 DIAGNOSIS — E119 Type 2 diabetes mellitus without complications: Secondary | ICD-10-CM | POA: Diagnosis not present

## 2022-09-27 DIAGNOSIS — I10 Essential (primary) hypertension: Secondary | ICD-10-CM | POA: Diagnosis not present

## 2022-10-14 DIAGNOSIS — I10 Essential (primary) hypertension: Secondary | ICD-10-CM | POA: Diagnosis not present

## 2022-10-14 DIAGNOSIS — E119 Type 2 diabetes mellitus without complications: Secondary | ICD-10-CM | POA: Diagnosis not present

## 2022-10-26 DIAGNOSIS — E1165 Type 2 diabetes mellitus with hyperglycemia: Secondary | ICD-10-CM | POA: Diagnosis not present

## 2022-10-28 DIAGNOSIS — I10 Essential (primary) hypertension: Secondary | ICD-10-CM | POA: Diagnosis not present

## 2022-10-28 DIAGNOSIS — E119 Type 2 diabetes mellitus without complications: Secondary | ICD-10-CM | POA: Diagnosis not present

## 2022-11-13 DIAGNOSIS — I10 Essential (primary) hypertension: Secondary | ICD-10-CM | POA: Diagnosis not present

## 2022-11-13 DIAGNOSIS — E119 Type 2 diabetes mellitus without complications: Secondary | ICD-10-CM | POA: Diagnosis not present

## 2022-11-17 DIAGNOSIS — E7849 Other hyperlipidemia: Secondary | ICD-10-CM | POA: Diagnosis not present

## 2022-11-17 DIAGNOSIS — E1122 Type 2 diabetes mellitus with diabetic chronic kidney disease: Secondary | ICD-10-CM | POA: Diagnosis not present

## 2022-11-17 DIAGNOSIS — E1121 Type 2 diabetes mellitus with diabetic nephropathy: Secondary | ICD-10-CM | POA: Diagnosis not present

## 2022-11-17 DIAGNOSIS — N183 Chronic kidney disease, stage 3 unspecified: Secondary | ICD-10-CM | POA: Diagnosis not present

## 2022-11-24 DIAGNOSIS — E7849 Other hyperlipidemia: Secondary | ICD-10-CM | POA: Diagnosis not present

## 2022-11-24 DIAGNOSIS — E1140 Type 2 diabetes mellitus with diabetic neuropathy, unspecified: Secondary | ICD-10-CM | POA: Diagnosis not present

## 2022-11-24 DIAGNOSIS — Z23 Encounter for immunization: Secondary | ICD-10-CM | POA: Diagnosis not present

## 2022-11-24 DIAGNOSIS — R413 Other amnesia: Secondary | ICD-10-CM | POA: Diagnosis not present

## 2022-11-24 DIAGNOSIS — I10 Essential (primary) hypertension: Secondary | ICD-10-CM | POA: Diagnosis not present

## 2022-11-24 DIAGNOSIS — E1159 Type 2 diabetes mellitus with other circulatory complications: Secondary | ICD-10-CM | POA: Diagnosis not present

## 2022-11-24 DIAGNOSIS — E1165 Type 2 diabetes mellitus with hyperglycemia: Secondary | ICD-10-CM | POA: Diagnosis not present

## 2022-11-24 DIAGNOSIS — E1122 Type 2 diabetes mellitus with diabetic chronic kidney disease: Secondary | ICD-10-CM | POA: Diagnosis not present

## 2022-11-24 DIAGNOSIS — Z1389 Encounter for screening for other disorder: Secondary | ICD-10-CM | POA: Diagnosis not present

## 2022-11-25 DIAGNOSIS — E1165 Type 2 diabetes mellitus with hyperglycemia: Secondary | ICD-10-CM | POA: Diagnosis not present

## 2022-11-25 DIAGNOSIS — E1140 Type 2 diabetes mellitus with diabetic neuropathy, unspecified: Secondary | ICD-10-CM | POA: Diagnosis not present

## 2022-11-27 DIAGNOSIS — E119 Type 2 diabetes mellitus without complications: Secondary | ICD-10-CM | POA: Diagnosis not present

## 2022-11-27 DIAGNOSIS — I10 Essential (primary) hypertension: Secondary | ICD-10-CM | POA: Diagnosis not present

## 2022-12-13 DIAGNOSIS — E119 Type 2 diabetes mellitus without complications: Secondary | ICD-10-CM | POA: Diagnosis not present

## 2022-12-13 DIAGNOSIS — I10 Essential (primary) hypertension: Secondary | ICD-10-CM | POA: Diagnosis not present

## 2022-12-28 DIAGNOSIS — E119 Type 2 diabetes mellitus without complications: Secondary | ICD-10-CM | POA: Diagnosis not present

## 2022-12-28 DIAGNOSIS — I10 Essential (primary) hypertension: Secondary | ICD-10-CM | POA: Diagnosis not present

## 2023-01-12 DIAGNOSIS — E119 Type 2 diabetes mellitus without complications: Secondary | ICD-10-CM | POA: Diagnosis not present

## 2023-01-12 DIAGNOSIS — I10 Essential (primary) hypertension: Secondary | ICD-10-CM | POA: Diagnosis not present

## 2023-01-23 DIAGNOSIS — E119 Type 2 diabetes mellitus without complications: Secondary | ICD-10-CM | POA: Diagnosis not present

## 2023-01-23 DIAGNOSIS — I10 Essential (primary) hypertension: Secondary | ICD-10-CM | POA: Diagnosis not present

## 2023-01-28 DIAGNOSIS — E119 Type 2 diabetes mellitus without complications: Secondary | ICD-10-CM | POA: Diagnosis not present

## 2023-01-28 DIAGNOSIS — I10 Essential (primary) hypertension: Secondary | ICD-10-CM | POA: Diagnosis not present

## 2023-02-11 DIAGNOSIS — E119 Type 2 diabetes mellitus without complications: Secondary | ICD-10-CM | POA: Diagnosis not present

## 2023-02-11 DIAGNOSIS — I10 Essential (primary) hypertension: Secondary | ICD-10-CM | POA: Diagnosis not present

## 2023-02-27 DIAGNOSIS — E119 Type 2 diabetes mellitus without complications: Secondary | ICD-10-CM | POA: Diagnosis not present

## 2023-02-27 DIAGNOSIS — I10 Essential (primary) hypertension: Secondary | ICD-10-CM | POA: Diagnosis not present

## 2023-03-13 DIAGNOSIS — I10 Essential (primary) hypertension: Secondary | ICD-10-CM | POA: Diagnosis not present

## 2023-03-13 DIAGNOSIS — E119 Type 2 diabetes mellitus without complications: Secondary | ICD-10-CM | POA: Diagnosis not present

## 2023-03-20 DIAGNOSIS — E7849 Other hyperlipidemia: Secondary | ICD-10-CM | POA: Diagnosis not present

## 2023-03-20 DIAGNOSIS — E1122 Type 2 diabetes mellitus with diabetic chronic kidney disease: Secondary | ICD-10-CM | POA: Diagnosis not present

## 2023-03-20 DIAGNOSIS — N183 Chronic kidney disease, stage 3 unspecified: Secondary | ICD-10-CM | POA: Diagnosis not present

## 2023-03-20 DIAGNOSIS — E1121 Type 2 diabetes mellitus with diabetic nephropathy: Secondary | ICD-10-CM | POA: Diagnosis not present

## 2023-03-20 DIAGNOSIS — E781 Pure hyperglyceridemia: Secondary | ICD-10-CM | POA: Diagnosis not present

## 2023-03-20 DIAGNOSIS — E1159 Type 2 diabetes mellitus with other circulatory complications: Secondary | ICD-10-CM | POA: Diagnosis not present

## 2023-03-20 DIAGNOSIS — Z23 Encounter for immunization: Secondary | ICD-10-CM | POA: Diagnosis not present

## 2023-03-21 DIAGNOSIS — N183 Chronic kidney disease, stage 3 unspecified: Secondary | ICD-10-CM | POA: Diagnosis not present

## 2023-03-21 DIAGNOSIS — E782 Mixed hyperlipidemia: Secondary | ICD-10-CM | POA: Diagnosis not present

## 2023-03-27 DIAGNOSIS — I10 Essential (primary) hypertension: Secondary | ICD-10-CM | POA: Diagnosis not present

## 2023-03-27 DIAGNOSIS — E1122 Type 2 diabetes mellitus with diabetic chronic kidney disease: Secondary | ICD-10-CM | POA: Diagnosis not present

## 2023-03-27 DIAGNOSIS — N183 Chronic kidney disease, stage 3 unspecified: Secondary | ICD-10-CM | POA: Diagnosis not present

## 2023-03-27 DIAGNOSIS — E1140 Type 2 diabetes mellitus with diabetic neuropathy, unspecified: Secondary | ICD-10-CM | POA: Diagnosis not present

## 2023-03-27 DIAGNOSIS — E1159 Type 2 diabetes mellitus with other circulatory complications: Secondary | ICD-10-CM | POA: Diagnosis not present

## 2023-03-27 DIAGNOSIS — Z23 Encounter for immunization: Secondary | ICD-10-CM | POA: Diagnosis not present

## 2023-03-27 DIAGNOSIS — E1165 Type 2 diabetes mellitus with hyperglycemia: Secondary | ICD-10-CM | POA: Diagnosis not present

## 2023-03-27 DIAGNOSIS — M1000 Idiopathic gout, unspecified site: Secondary | ICD-10-CM | POA: Diagnosis not present

## 2023-03-27 DIAGNOSIS — R413 Other amnesia: Secondary | ICD-10-CM | POA: Diagnosis not present

## 2023-03-27 DIAGNOSIS — E7849 Other hyperlipidemia: Secondary | ICD-10-CM | POA: Diagnosis not present

## 2023-03-28 DIAGNOSIS — Z0001 Encounter for general adult medical examination with abnormal findings: Secondary | ICD-10-CM | POA: Diagnosis not present

## 2023-03-28 DIAGNOSIS — F17210 Nicotine dependence, cigarettes, uncomplicated: Secondary | ICD-10-CM | POA: Diagnosis not present

## 2023-03-28 DIAGNOSIS — E1122 Type 2 diabetes mellitus with diabetic chronic kidney disease: Secondary | ICD-10-CM | POA: Diagnosis not present

## 2023-03-30 DIAGNOSIS — E119 Type 2 diabetes mellitus without complications: Secondary | ICD-10-CM | POA: Diagnosis not present

## 2023-03-30 DIAGNOSIS — I10 Essential (primary) hypertension: Secondary | ICD-10-CM | POA: Diagnosis not present

## 2023-04-04 DIAGNOSIS — E119 Type 2 diabetes mellitus without complications: Secondary | ICD-10-CM | POA: Diagnosis not present

## 2023-04-04 DIAGNOSIS — I10 Essential (primary) hypertension: Secondary | ICD-10-CM | POA: Diagnosis not present

## 2023-04-12 DIAGNOSIS — I10 Essential (primary) hypertension: Secondary | ICD-10-CM | POA: Diagnosis not present

## 2023-04-12 DIAGNOSIS — E119 Type 2 diabetes mellitus without complications: Secondary | ICD-10-CM | POA: Diagnosis not present

## 2023-05-12 DIAGNOSIS — E119 Type 2 diabetes mellitus without complications: Secondary | ICD-10-CM | POA: Diagnosis not present

## 2023-05-12 DIAGNOSIS — I10 Essential (primary) hypertension: Secondary | ICD-10-CM | POA: Diagnosis not present

## 2023-05-30 DIAGNOSIS — E119 Type 2 diabetes mellitus without complications: Secondary | ICD-10-CM | POA: Diagnosis not present

## 2023-05-30 DIAGNOSIS — I10 Essential (primary) hypertension: Secondary | ICD-10-CM | POA: Diagnosis not present

## 2023-06-11 DIAGNOSIS — I10 Essential (primary) hypertension: Secondary | ICD-10-CM | POA: Diagnosis not present

## 2023-06-11 DIAGNOSIS — E119 Type 2 diabetes mellitus without complications: Secondary | ICD-10-CM | POA: Diagnosis not present

## 2023-06-30 DIAGNOSIS — I10 Essential (primary) hypertension: Secondary | ICD-10-CM | POA: Diagnosis not present

## 2023-06-30 DIAGNOSIS — E119 Type 2 diabetes mellitus without complications: Secondary | ICD-10-CM | POA: Diagnosis not present

## 2023-07-11 DIAGNOSIS — E119 Type 2 diabetes mellitus without complications: Secondary | ICD-10-CM | POA: Diagnosis not present

## 2023-07-11 DIAGNOSIS — I10 Essential (primary) hypertension: Secondary | ICD-10-CM | POA: Diagnosis not present

## 2023-07-18 DIAGNOSIS — E1122 Type 2 diabetes mellitus with diabetic chronic kidney disease: Secondary | ICD-10-CM | POA: Diagnosis not present

## 2023-07-18 DIAGNOSIS — E1142 Type 2 diabetes mellitus with diabetic polyneuropathy: Secondary | ICD-10-CM | POA: Diagnosis not present

## 2023-07-18 DIAGNOSIS — E7849 Other hyperlipidemia: Secondary | ICD-10-CM | POA: Diagnosis not present

## 2023-07-24 DIAGNOSIS — E1122 Type 2 diabetes mellitus with diabetic chronic kidney disease: Secondary | ICD-10-CM | POA: Diagnosis not present

## 2023-07-24 DIAGNOSIS — E1165 Type 2 diabetes mellitus with hyperglycemia: Secondary | ICD-10-CM | POA: Diagnosis not present

## 2023-07-24 DIAGNOSIS — E1142 Type 2 diabetes mellitus with diabetic polyneuropathy: Secondary | ICD-10-CM | POA: Diagnosis not present

## 2023-07-24 DIAGNOSIS — E1159 Type 2 diabetes mellitus with other circulatory complications: Secondary | ICD-10-CM | POA: Diagnosis not present

## 2023-07-28 DIAGNOSIS — I10 Essential (primary) hypertension: Secondary | ICD-10-CM | POA: Diagnosis not present

## 2023-07-28 DIAGNOSIS — E119 Type 2 diabetes mellitus without complications: Secondary | ICD-10-CM | POA: Diagnosis not present

## 2023-08-10 DIAGNOSIS — E119 Type 2 diabetes mellitus without complications: Secondary | ICD-10-CM | POA: Diagnosis not present

## 2023-08-10 DIAGNOSIS — I10 Essential (primary) hypertension: Secondary | ICD-10-CM | POA: Diagnosis not present

## 2023-08-28 DIAGNOSIS — I10 Essential (primary) hypertension: Secondary | ICD-10-CM | POA: Diagnosis not present

## 2023-08-28 DIAGNOSIS — E119 Type 2 diabetes mellitus without complications: Secondary | ICD-10-CM | POA: Diagnosis not present

## 2023-09-09 DIAGNOSIS — I10 Essential (primary) hypertension: Secondary | ICD-10-CM | POA: Diagnosis not present

## 2023-09-09 DIAGNOSIS — E119 Type 2 diabetes mellitus without complications: Secondary | ICD-10-CM | POA: Diagnosis not present

## 2023-09-27 DIAGNOSIS — I10 Essential (primary) hypertension: Secondary | ICD-10-CM | POA: Diagnosis not present

## 2023-09-27 DIAGNOSIS — E119 Type 2 diabetes mellitus without complications: Secondary | ICD-10-CM | POA: Diagnosis not present

## 2023-10-09 DIAGNOSIS — I10 Essential (primary) hypertension: Secondary | ICD-10-CM | POA: Diagnosis not present

## 2023-10-09 DIAGNOSIS — E119 Type 2 diabetes mellitus without complications: Secondary | ICD-10-CM | POA: Diagnosis not present

## 2023-10-28 DIAGNOSIS — I10 Essential (primary) hypertension: Secondary | ICD-10-CM | POA: Diagnosis not present

## 2023-10-28 DIAGNOSIS — E119 Type 2 diabetes mellitus without complications: Secondary | ICD-10-CM | POA: Diagnosis not present

## 2023-11-08 DIAGNOSIS — I10 Essential (primary) hypertension: Secondary | ICD-10-CM | POA: Diagnosis not present

## 2023-11-08 DIAGNOSIS — E119 Type 2 diabetes mellitus without complications: Secondary | ICD-10-CM | POA: Diagnosis not present

## 2023-11-13 DIAGNOSIS — E1142 Type 2 diabetes mellitus with diabetic polyneuropathy: Secondary | ICD-10-CM | POA: Diagnosis not present

## 2023-11-13 DIAGNOSIS — E1122 Type 2 diabetes mellitus with diabetic chronic kidney disease: Secondary | ICD-10-CM | POA: Diagnosis not present

## 2023-11-13 DIAGNOSIS — E1165 Type 2 diabetes mellitus with hyperglycemia: Secondary | ICD-10-CM | POA: Diagnosis not present

## 2023-11-13 DIAGNOSIS — E7849 Other hyperlipidemia: Secondary | ICD-10-CM | POA: Diagnosis not present

## 2023-11-13 DIAGNOSIS — E1159 Type 2 diabetes mellitus with other circulatory complications: Secondary | ICD-10-CM | POA: Diagnosis not present

## 2023-11-20 DIAGNOSIS — E782 Mixed hyperlipidemia: Secondary | ICD-10-CM | POA: Diagnosis not present

## 2023-11-20 DIAGNOSIS — N183 Chronic kidney disease, stage 3 unspecified: Secondary | ICD-10-CM | POA: Diagnosis not present

## 2023-11-20 DIAGNOSIS — E1165 Type 2 diabetes mellitus with hyperglycemia: Secondary | ICD-10-CM | POA: Diagnosis not present

## 2023-11-20 DIAGNOSIS — E7849 Other hyperlipidemia: Secondary | ICD-10-CM | POA: Diagnosis not present

## 2023-11-20 DIAGNOSIS — M1000 Idiopathic gout, unspecified site: Secondary | ICD-10-CM | POA: Diagnosis not present

## 2023-11-27 DIAGNOSIS — I10 Essential (primary) hypertension: Secondary | ICD-10-CM | POA: Diagnosis not present

## 2023-11-27 DIAGNOSIS — E119 Type 2 diabetes mellitus without complications: Secondary | ICD-10-CM | POA: Diagnosis not present

## 2023-12-08 DIAGNOSIS — E119 Type 2 diabetes mellitus without complications: Secondary | ICD-10-CM | POA: Diagnosis not present

## 2023-12-08 DIAGNOSIS — I10 Essential (primary) hypertension: Secondary | ICD-10-CM | POA: Diagnosis not present

## 2023-12-28 DIAGNOSIS — E119 Type 2 diabetes mellitus without complications: Secondary | ICD-10-CM | POA: Diagnosis not present

## 2023-12-28 DIAGNOSIS — I10 Essential (primary) hypertension: Secondary | ICD-10-CM | POA: Diagnosis not present

## 2024-01-07 DIAGNOSIS — E119 Type 2 diabetes mellitus without complications: Secondary | ICD-10-CM | POA: Diagnosis not present

## 2024-01-07 DIAGNOSIS — I10 Essential (primary) hypertension: Secondary | ICD-10-CM | POA: Diagnosis not present

## 2024-01-28 DIAGNOSIS — E119 Type 2 diabetes mellitus without complications: Secondary | ICD-10-CM | POA: Diagnosis not present

## 2024-01-28 DIAGNOSIS — I10 Essential (primary) hypertension: Secondary | ICD-10-CM | POA: Diagnosis not present

## 2024-02-06 DIAGNOSIS — I10 Essential (primary) hypertension: Secondary | ICD-10-CM | POA: Diagnosis not present

## 2024-02-06 DIAGNOSIS — E119 Type 2 diabetes mellitus without complications: Secondary | ICD-10-CM | POA: Diagnosis not present

## 2024-02-27 DIAGNOSIS — I10 Essential (primary) hypertension: Secondary | ICD-10-CM | POA: Diagnosis not present

## 2024-02-27 DIAGNOSIS — E119 Type 2 diabetes mellitus without complications: Secondary | ICD-10-CM | POA: Diagnosis not present

## 2024-03-05 NOTE — Progress Notes (Signed)
 Steven Dominguez                                          MRN: [id number]   03/05/2024   The [facility] Quality Team Specialist reviewed this patient medical record for the purposes of chart review for care gap closure. The following were reviewed: chart review for care gap closure-kidney health evaluation for diabetes:uACR.    [facility] Quality Team

## 2024-03-07 DIAGNOSIS — E119 Type 2 diabetes mellitus without complications: Secondary | ICD-10-CM | POA: Diagnosis not present

## 2024-03-07 DIAGNOSIS — I10 Essential (primary) hypertension: Secondary | ICD-10-CM | POA: Diagnosis not present

## 2024-03-14 DIAGNOSIS — N183 Chronic kidney disease, stage 3 unspecified: Secondary | ICD-10-CM | POA: Diagnosis not present

## 2024-03-14 DIAGNOSIS — I10 Essential (primary) hypertension: Secondary | ICD-10-CM | POA: Diagnosis not present

## 2024-03-14 DIAGNOSIS — E7849 Other hyperlipidemia: Secondary | ICD-10-CM | POA: Diagnosis not present

## 2024-03-14 DIAGNOSIS — E1165 Type 2 diabetes mellitus with hyperglycemia: Secondary | ICD-10-CM | POA: Diagnosis not present

## 2024-03-21 DIAGNOSIS — I10 Essential (primary) hypertension: Secondary | ICD-10-CM | POA: Diagnosis not present

## 2024-03-21 DIAGNOSIS — M1000 Idiopathic gout, unspecified site: Secondary | ICD-10-CM | POA: Diagnosis not present

## 2024-03-21 DIAGNOSIS — N183 Chronic kidney disease, stage 3 unspecified: Secondary | ICD-10-CM | POA: Diagnosis not present

## 2024-03-21 DIAGNOSIS — E1165 Type 2 diabetes mellitus with hyperglycemia: Secondary | ICD-10-CM | POA: Diagnosis not present

## 2024-03-25 ENCOUNTER — Encounter (INDEPENDENT_AMBULATORY_CARE_PROVIDER_SITE_OTHER): Payer: Self-pay | Admitting: *Deleted

## 2024-04-23 ENCOUNTER — Telehealth: Payer: Self-pay | Admitting: Internal Medicine

## 2024-04-23 NOTE — Telephone Encounter (Signed)
 Received patient's colonoscopy questionnaire and checked tends to be constipated/uses laxatives.  Left a message asking pt to call office to schedule an office visit.

## 2024-04-29 DEATH — deceased
